# Patient Record
Sex: Female | Born: 1955
Health system: Southern US, Community
[De-identification: ages and names within clinical notes are randomized; demographics above are authoritative.]

## PROBLEM LIST (undated history)

## (undated) DIAGNOSIS — I10 Essential (primary) hypertension: Secondary | ICD-10-CM

## (undated) DIAGNOSIS — E119 Type 2 diabetes mellitus without complications: Secondary | ICD-10-CM

## (undated) DIAGNOSIS — N3090 Cystitis, unspecified without hematuria: Secondary | ICD-10-CM

## (undated) HISTORY — DX: Essential (primary) hypertension: I10

## (undated) HISTORY — PX: ABDOMINAL HYSTERECTOMY: SHX81

## (undated) HISTORY — DX: Cystitis, unspecified without hematuria: N30.90

## (undated) HISTORY — DX: Type 2 diabetes mellitus without complications: E11.9

---

## 2000-08-01 ENCOUNTER — Other Ambulatory Visit: Admission: RE | Admit: 2000-08-01 | Discharge: 2000-08-01 | Payer: Self-pay | Admitting: Obstetrics

## 2003-04-19 ENCOUNTER — Ambulatory Visit (HOSPITAL_COMMUNITY): Admission: RE | Admit: 2003-04-19 | Discharge: 2003-04-19 | Payer: Self-pay | Admitting: Family Medicine

## 2003-04-22 ENCOUNTER — Encounter: Admission: RE | Admit: 2003-04-22 | Discharge: 2003-04-22 | Payer: Self-pay | Admitting: Family Medicine

## 2003-05-04 ENCOUNTER — Ambulatory Visit (HOSPITAL_COMMUNITY): Admission: RE | Admit: 2003-05-04 | Discharge: 2003-05-04 | Payer: Self-pay | Admitting: Family Medicine

## 2003-06-05 ENCOUNTER — Ambulatory Visit (HOSPITAL_COMMUNITY): Admission: RE | Admit: 2003-06-05 | Discharge: 2003-06-05 | Payer: Self-pay | Admitting: Family Medicine

## 2003-06-16 ENCOUNTER — Ambulatory Visit (HOSPITAL_COMMUNITY): Admission: RE | Admit: 2003-06-16 | Discharge: 2003-06-16 | Payer: Self-pay | Admitting: General Surgery

## 2003-06-24 ENCOUNTER — Ambulatory Visit (HOSPITAL_COMMUNITY): Admission: RE | Admit: 2003-06-24 | Discharge: 2003-06-24 | Payer: Self-pay | Admitting: General Surgery

## 2003-07-13 ENCOUNTER — Ambulatory Visit (HOSPITAL_COMMUNITY): Admission: RE | Admit: 2003-07-13 | Discharge: 2003-07-14 | Payer: Self-pay | Admitting: General Surgery

## 2004-01-14 ENCOUNTER — Ambulatory Visit (HOSPITAL_COMMUNITY): Admission: RE | Admit: 2004-01-14 | Discharge: 2004-01-14 | Payer: Self-pay | Admitting: Urology

## 2004-05-01 IMAGING — XA DG CHOLANGIOGRAM OPERATIVE
1 series · 1 of 1 positions shown · non-contrast
Comparison: none

CLINICAL DATA: 47 year old with symptomatic biliary tract.  Laparoscopic cholecystectomy. 
 INTRAOPERATIVE CHOLANGIOGRAM 
 Intraoperative cholangiogram is performed by Dr. Estela De.  One image is submitted, showing opacification of a portion of the intrahepatic and extrahepatic biliary ducts.  Opacification of a portion of the pancreatic duct is also seen. No filling defects are noted. No focal stricture identified.  Patency of the duct is confirmed by the presence of contrast within the duodenum. 
 IMPRESSION 
 No evidence for retained stones.
 Reflux of contrast into normal appearing distal pancreatic duct.

[Series 1: run · 1 of 1 slices shown]
[im 1/1]
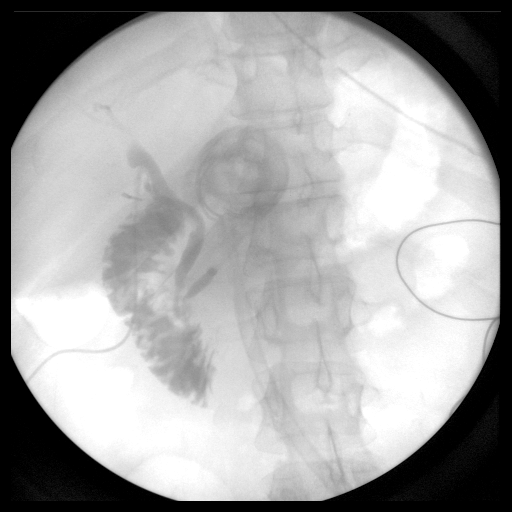

[1 of 1 positions shown; findings below may reference images not displayed]

## 2004-07-26 ENCOUNTER — Other Ambulatory Visit: Admission: RE | Admit: 2004-07-26 | Discharge: 2004-07-26 | Payer: Self-pay | Admitting: Family Medicine

## 2004-08-21 ENCOUNTER — Ambulatory Visit (HOSPITAL_COMMUNITY): Admission: RE | Admit: 2004-08-21 | Discharge: 2004-08-21 | Payer: Self-pay | Admitting: Gastroenterology

## 2004-10-24 ENCOUNTER — Ambulatory Visit (HOSPITAL_COMMUNITY): Admission: RE | Admit: 2004-10-24 | Discharge: 2004-10-24 | Payer: Self-pay | Admitting: Family Medicine

## 2005-04-12 ENCOUNTER — Encounter: Admission: RE | Admit: 2005-04-12 | Discharge: 2005-04-12 | Payer: Self-pay | Admitting: Family Medicine

## 2005-06-02 ENCOUNTER — Encounter: Admission: RE | Admit: 2005-06-02 | Discharge: 2005-06-02 | Payer: Self-pay | Admitting: Family Medicine

## 2005-12-10 ENCOUNTER — Encounter: Admission: RE | Admit: 2005-12-10 | Discharge: 2005-12-10 | Payer: Self-pay | Admitting: Family Medicine

## 2010-06-10 ENCOUNTER — Encounter (HOSPITAL_BASED_OUTPATIENT_CLINIC_OR_DEPARTMENT_OTHER): Payer: Self-pay | Admitting: General Surgery

## 2011-03-12 ENCOUNTER — Ambulatory Visit
Admission: RE | Admit: 2011-03-12 | Discharge: 2011-03-12 | Disposition: A | Discharge status: Home / Self Care | Payer: 59 | Source: Ambulatory Visit | Attending: Family Medicine | Admitting: Family Medicine

## 2011-03-12 ENCOUNTER — Other Ambulatory Visit: Payer: Self-pay | Admitting: Family Medicine

## 2011-03-12 DIAGNOSIS — R1032 Left lower quadrant pain: Secondary | ICD-10-CM

## 2011-03-12 MED ORDER — IOHEXOL 300 MG/ML  SOLN
125.0000 mL | Freq: Once | INTRAMUSCULAR | Status: AC | PRN
  Administered 2011-03-12: 125 mL via INTRAVENOUS

## 2011-12-30 IMAGING — CT CT ABD-PELV W/ CM
1 of 5 series · 14 of 34 positions shown, 19 images · IV contrast (30CC OMNI 300 & [ID] OMNI 300)
Comparison: CT thorax 12/10/2005,  pelvis [DATE]

CLINICAL DATA: Left lower quadrant pain, pain for 9 days.

CT ABDOMEN AND PELVIS WITH CONTRAST
TECHNIQUE: Multidetector CT imaging of the abdomen and pelvis was
performed following the standard protocol during bolus
administration of intravenous contrast.
Contrast: 125mL OMNIPAQUE IOHEXOL 300 MG/ML IV SOLN

[Series 2: abd/pelvis with · axial · 0.80mm/px · z∈[-394,-18]mm · 14 of 86 slices shown, 19 images]
[im 6/86  soft-tissue]
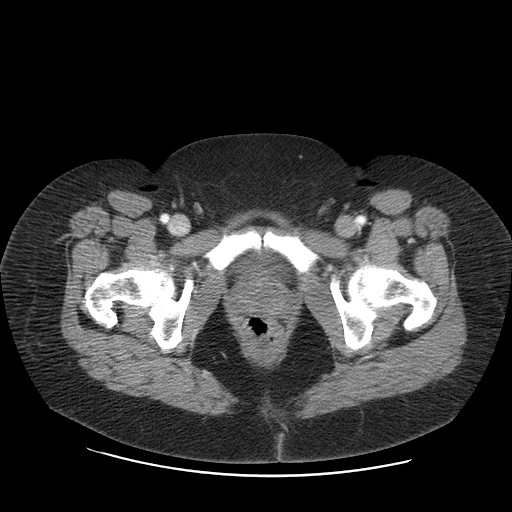
[im 6/86  bone]
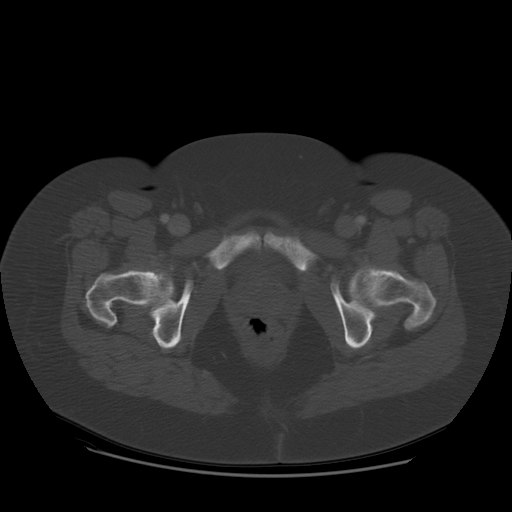
[im 11/86  soft-tissue]
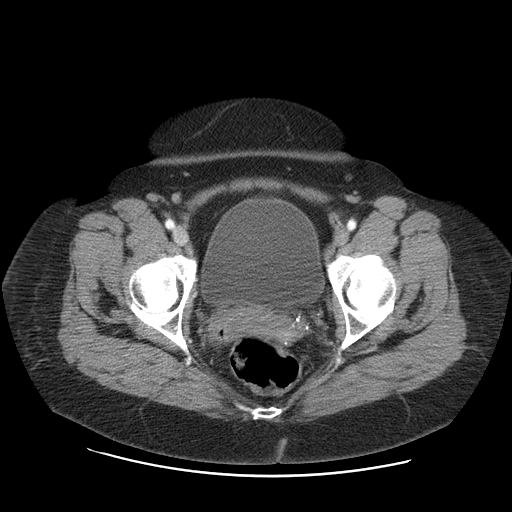
[im 21/86  soft-tissue]
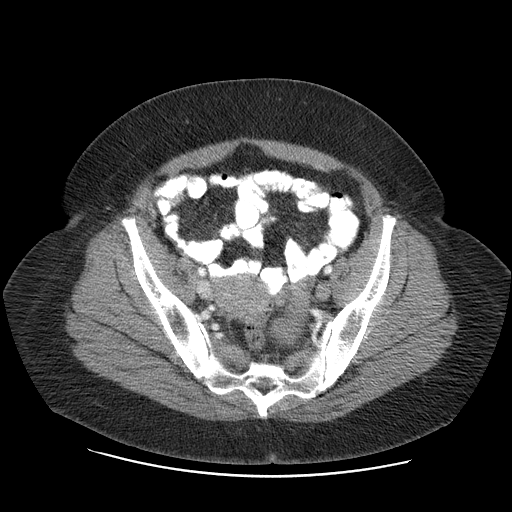
[im 26/86  soft-tissue]
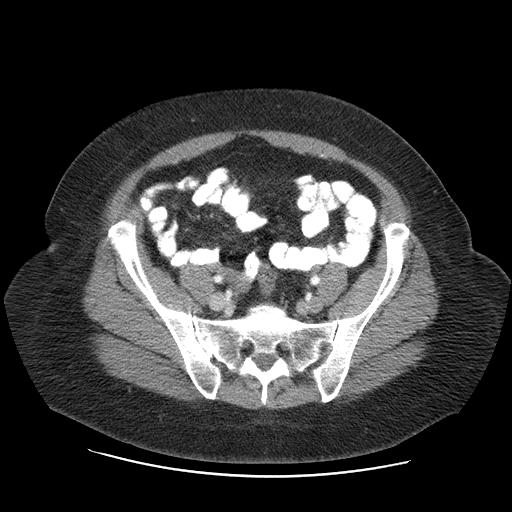
[im 31/86  soft-tissue]
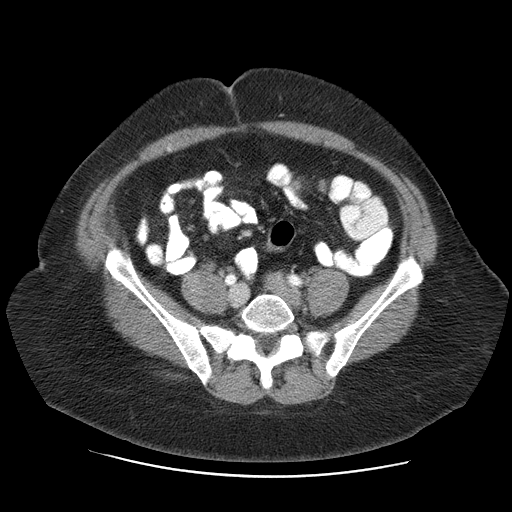
[im 36/86  soft-tissue]
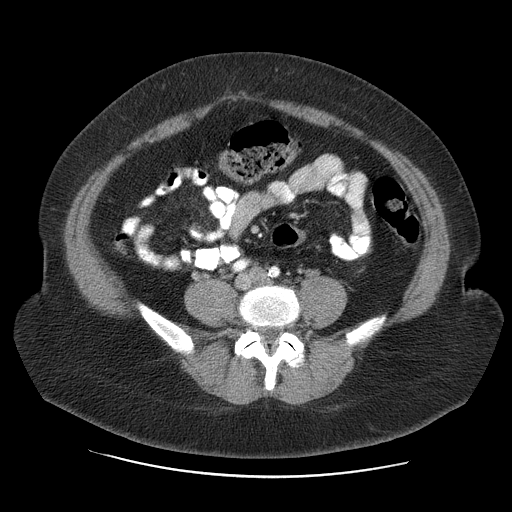
[im 46/86  soft-tissue]
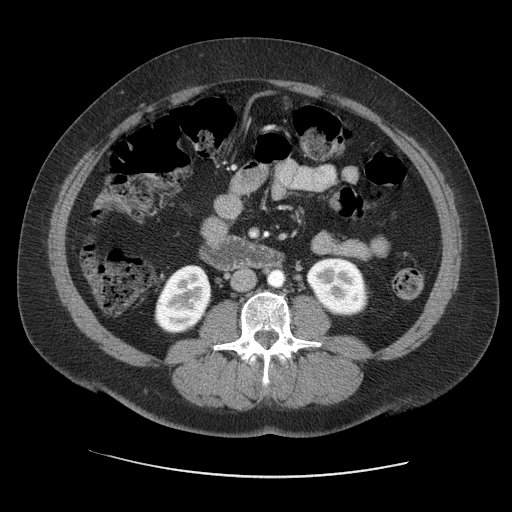
[im 51/86  soft-tissue]
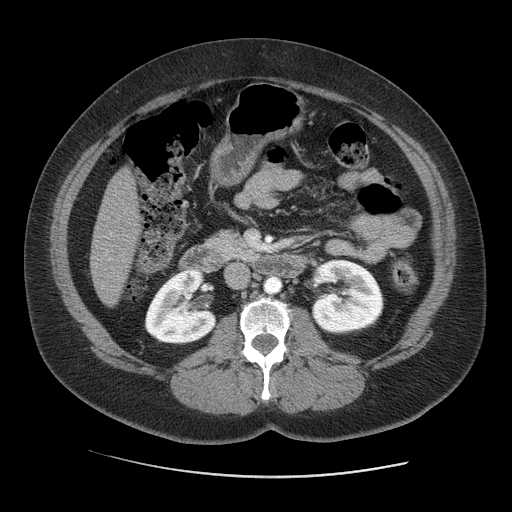
[im 56/86  soft-tissue]
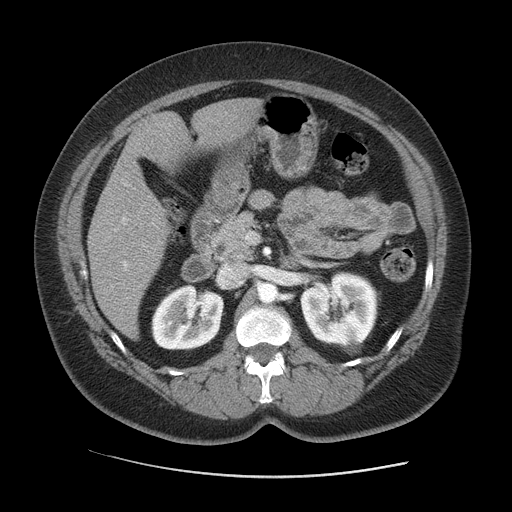
[im 56/86  bone]
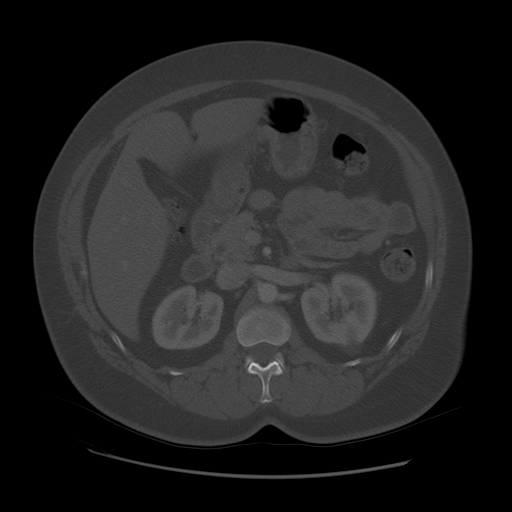
[im 61/86  soft-tissue]
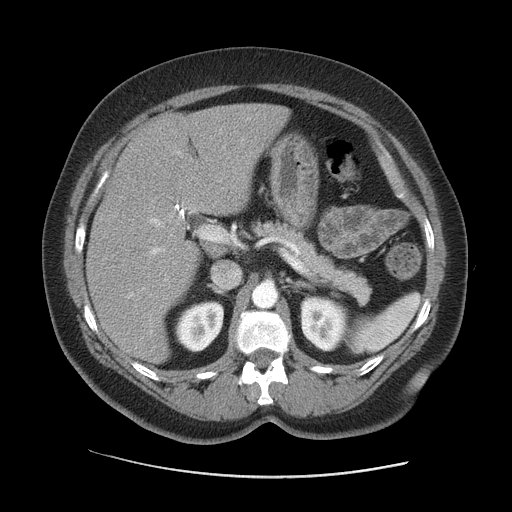
[im 66/86  soft-tissue]
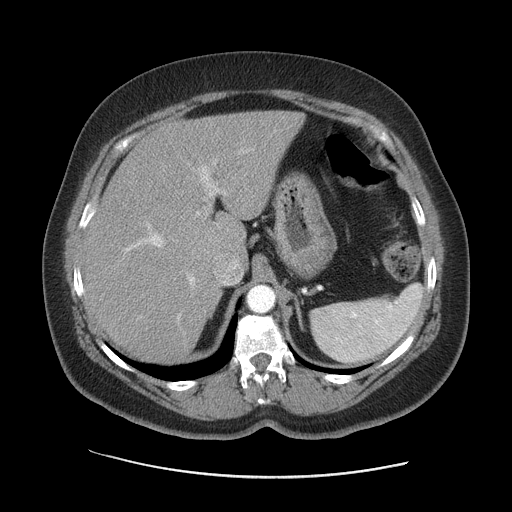
[im 66/86  lung]
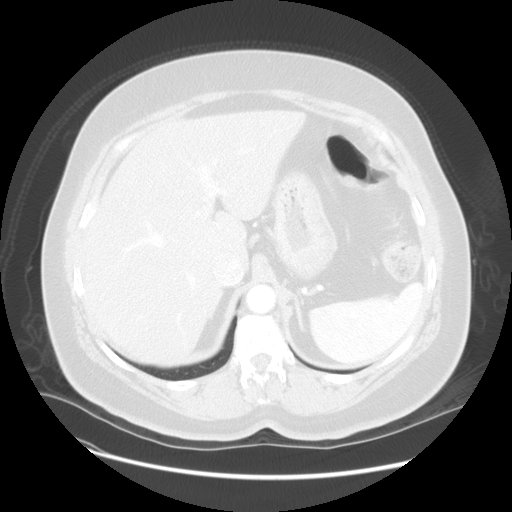
[im 71/86  lung]
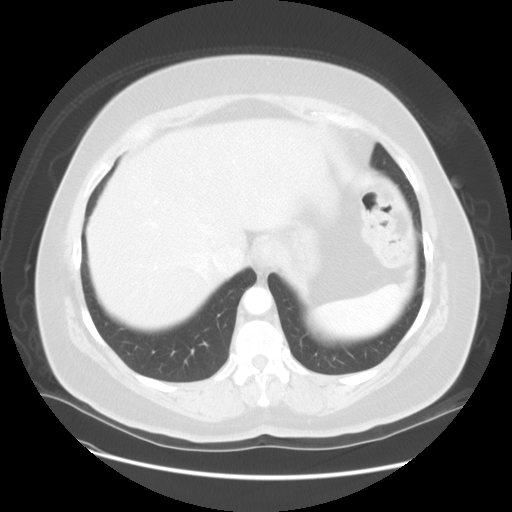
[im 76/86  soft-tissue]
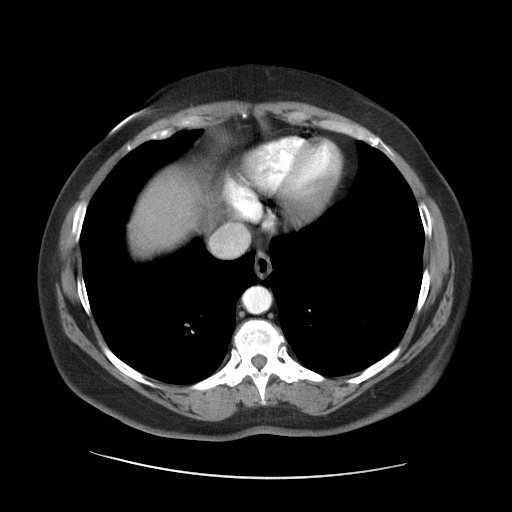
[im 76/86  lung]
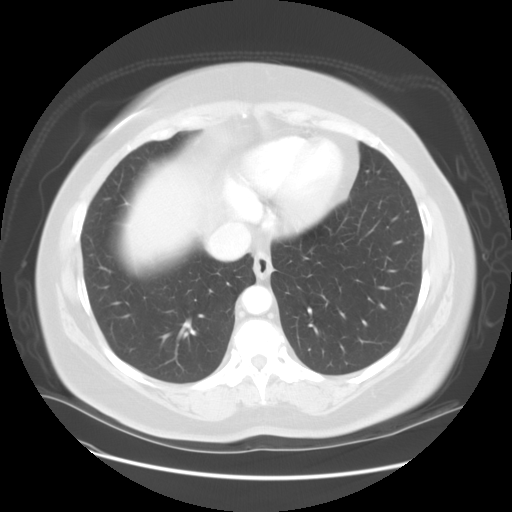
[im 81/86  soft-tissue]
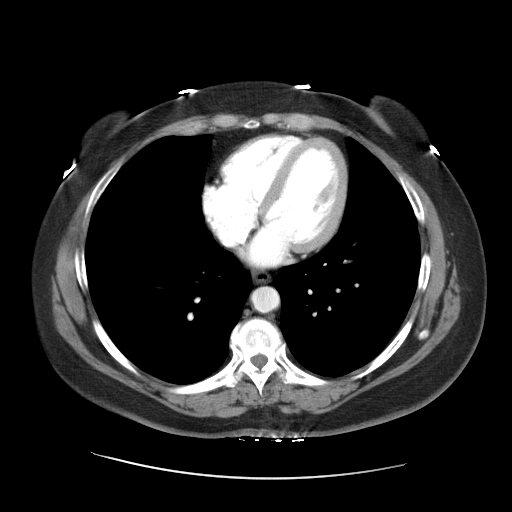
[im 81/86  lung]
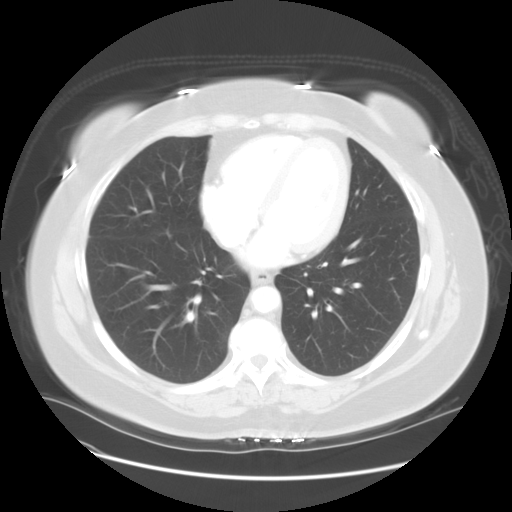

[14 of 34 positions shown; findings below may reference images not displayed]

FINDINGS: Lung bases are clear.  No pericardial fluid.

No focal hepatic lesion.  Gallbladder surgically absent.  The
pancreas, spleen, adrenal glands, right kidney normal.  A simple
cyst within the left kidney.

The stomach, small bowel, and cecum are normal.  Appendix is
normal.  The colon rectosigmoid colon are normal.

Abdominal aorta normal caliber.  No retroperitoneal
lymphadenopathy.

Uterus is normal.  There is a 6.4 x 4.4 x 4.7 cm  rounded left
adnexal mass immediately adjacent to the uterus which is silent.
On comparison MRI from 8991, left ovary measures approximately
x 1.7 cm.  The right ovary is not identified.  Bladder is normal.

No evidence of pelvic lymphadenopathy. Review of  bone windows
demonstrates no aggressive osseous lesions.
IMPRESSION: 1. This is a 6.5 cm smoothly marginated left adnexal mass.  This
was not present on comparison MRI therefore a exophytic leiomyoma
is unlikely.  Recommend GYN consultation and consider pelvic MRI
with contrast  for further characterization.
2.  No acute abdominal pelvic process.

This was made a call report

## 2015-05-21 DIAGNOSIS — N3090 Cystitis, unspecified without hematuria: Secondary | ICD-10-CM

## 2015-05-21 HISTORY — DX: Cystitis, unspecified without hematuria: N30.90

## 2016-12-16 LAB — LIPID PANEL: LDL Cholesterol: 114

## 2016-12-16 LAB — BASIC METABOLIC PANEL: Creatinine: 1 (ref 0.5–1.1)

## 2016-12-16 LAB — HEMOGLOBIN A1C: Hemoglobin A1C: 10.7

## 2017-02-27 ENCOUNTER — Ambulatory Visit (INDEPENDENT_AMBULATORY_CARE_PROVIDER_SITE_OTHER): Payer: 59 | Admitting: Endocrinology

## 2017-02-27 ENCOUNTER — Encounter (INDEPENDENT_AMBULATORY_CARE_PROVIDER_SITE_OTHER): Payer: Self-pay

## 2017-02-27 ENCOUNTER — Encounter: Payer: Self-pay | Admitting: Endocrinology

## 2017-02-27 VITALS — BP 136/90 | HR 61 | Ht 66.0 in | Wt 214.6 lb

## 2017-02-27 DIAGNOSIS — Z794 Long term (current) use of insulin: Secondary | ICD-10-CM

## 2017-02-27 DIAGNOSIS — E669 Obesity, unspecified: Secondary | ICD-10-CM | POA: Diagnosis not present

## 2017-02-27 DIAGNOSIS — E1165 Type 2 diabetes mellitus with hyperglycemia: Secondary | ICD-10-CM

## 2017-02-27 DIAGNOSIS — I10 Essential (primary) hypertension: Secondary | ICD-10-CM

## 2017-02-27 MED ORDER — METFORMIN HCL ER 500 MG PO TB24: 2000.0000 mg | ORAL_TABLET | Freq: Every day | ORAL | 3 refills | Status: DC

## 2017-02-27 NOTE — Patient Instructions (Signed)
Start taking Metformin 500 mg, 1 tablet with your main meal for 5 days. Occasionally this may initially cause loose stools or nausea If  tolerating well after 5 days add a second Metformin tablet (500 mg) at the same time. Continue adding another tablet after 5 days days if no persistent nausea or diarrhea until reaching the maximum tolerated dose or the full dose of 4 tablets once daily.  New basals  Exercise daily

## 2017-02-27 NOTE — Progress Notes (Signed)
Patient ID: Sherry Aguilar, female   DOB: Aug 04, 1955, 61 y.o.   MRN: [id number]          Reason for Appointment : Consultation for Type 1 Diabetes  History of Present Illness          Diagnosis: Type 1 diabetes mellitus, date of diagnosis: 2010          Previous history:  She was initially diagnosed with routine lab work Not clear what medications she was taking after diagnosis and she does not remember taking metformin At some point she was taking Onglyza and Actos and also subsequently Anastasio Auerbach was tried She stopped taking Invokana because of persistent yeast infections Most likely in 2013 because of increasing blood sugars she was started on Levemir and oral hypoglycemic drugs probably stopped She thinks she may have been on insulin injections twice a day for a year but probably did not take any mealtime insulin She has had an A1c of about 7% only once  Recent history:    INSULIN regimen is: Insulin pump, Medtronic BASAL settings: Midnight = 1.1:, 3 AM = 1.25, 8 & 1.1 Carbohydrate coverage 1:7.5 and sensitivity 1:33 with target 120   Current management, blood sugar patterns and problems identified:    She apparently has been on an insulin pump for about 4 years, prior to that had been on Levemir  She has had no significant diabetes education and was doing on the insulin pump by the [employer] representative and she has not had any change in her settings since starting the pump  She does try to change her infusion set every 3 days  She just started a new pump for a replacement a couple of days ago and has minimal data on the pump  BOLUSES: She tries to take these mostly in the morning when her blood sugar is high but frequently will not take boluses for her lunch meal which she is usually eating at her desk  Also may not do a bolus for evening meal especially if her blood sugars are not high  She has not been eating much during the day she does feel shaky around suppertime  although her sugar may not be below 80  She is not doing blood sugar testing after meals and mostly in the morning and before supper time  She has exercised on and off and currently is not doing any even though she has a treadmill at home  She uses various resources and reading labels for reviewing of her carbohydrates at mealtime but may not do this consistently   Glucose monitoring:  is being done 2 times a day         Glucometer:   contour     Blood Glucose readings from  recall:  Mean values apply above for all meters except median for One Touch  PRE-MEAL Fasting Lunch Dinner Bedtime Overall  Glucose range: 250+  80-170 ?   Mean/median:         Hypoglycemia:  occurs at supper time As above Factors causing hyperglycemia: Symptoms of hypoglycemia: Feeling shaky  Self-care: The diet that the patient has been following [id number] sugar drinks  Mealtimes are: Breakfast at [employer]: 7-9 PM          Exercise: none           Dietician consultation: Most recent: Never .          CDE consultation:none   Previous weight range: 210-236  Wt Readings  from Last 3 Encounters:  02/27/17 214 lb 9.6 oz (97.3 kg)   Diabetes labs:  Lab Results  Component Value Date   HGBA1C 10.7 12/16/2016   Lab Results  Component Value Date   LDLCALC 114 12/16/2016   CREATININE 1.0 12/16/2016    No results found for: MICRALBCREAT   Allergies as of 02/27/2017      Reactions   Pecan Nut (diagnostic) Anaphylaxis   Tree nut allergy      Medication List       Accurate as of 02/27/17  3:11 PM. Always use your most recent med list.          CARDURA PO doxazosin takes 1 mg at bedtime   HUMALOG 100 UNIT/ML injection Generic drug:  insulin lispro Inject 50 Units into the skin daily.   metFORMIN 500 MG 24 hr tablet Commonly known as:  GLUCOPHAGE-XR Take 4 tablets (2,000 mg total) by mouth daily with supper.   spironolactone 25 MG tablet Commonly known as:  ALDACTONE Take 25  mg by mouth 2 (two) times daily.   TEKTURNA HCT 150-12.5 MG Tabs Generic drug:  Aliskiren-Hydrochlorothiazide Take 1 tablet by mouth daily.   ZORVOLEX 35 MG Caps Generic drug:  Diclofenac Take 1 tablet by mouth 3 (three) times daily.       Allergies:  Allergies  Allergen Reactions  . Pecan Nut (Diagnostic) Anaphylaxis    Tree nut allergy    Past Medical History:  Diagnosis Date  . Cystitis 2017    No past surgical history on file.  Family History  Problem Relation Age of Onset  . Diabetes Mother   . Heart failure Mother   . Diabetes Father   . Stroke Father   . Diabetes Sister   . Diabetes Brother   . Thyroid disease Neg Hx     Social History:  reports that she has quit smoking. She has never used smokeless tobacco. Her alcohol and drug histories are not on file.      Review of Systems  Constitutional: Negative for weight loss and reduced appetite.  HENT: Negative for headaches.   Eyes: Negative for blurred vision.  Respiratory: Negative for shortness of breath.   Cardiovascular: Negative for leg swelling.  Gastrointestinal: Negative for diarrhea.  Endocrine: Negative for fatigue, cold intolerance and polydipsia.  Genitourinary: Positive for nocturia.  Musculoskeletal: Negative for joint pain.  Skin: Negative for rash.  Neurological: Positive for numbness.       At times and burning in her toes and one of her toes may feel numb  Psychiatric/Behavioral: Negative for insomnia.   Hypertension: She has been on multiple antihypertensives for a few years  Lipids: She has never been on treatment for hypercholesterolemia, last LDL 114  Eye exams: Last 8/18 with optometrist  LABS:  Office Visit on 02/27/2017  Component Date Value Ref Range Status  . Creatinine 12/16/2016 1.0  0.5 - 1.1 Final  . LDL Cholesterol 12/16/2016 114   Final  . Hemoglobin A1C 12/16/2016 10.7   Final    Physical Examination:  BP 136/90   Pulse 61   Ht 5\' 6"  (1.676 m)   Wt 214  lb 9.6 oz (97.3 kg)   LMP 03/31/2011   SpO2 97%   BMI 34.64 kg/m   GENERAL: Moderate generalized obesity present HEENT:         Eye exam shows normal external appearance. Fundus exam difficult to achieve but no gross retinopathy NECK:     acanthosis is present.  No lymphadenopathy.   Thyroid is not enlarged and no nodules felt. No carotid bruit heard    LUNGS:         Chest is symmetrical. Lungs are clear to auscultation.Marland Kitchen   HEART:         Heart sounds:  S1 and S2 are normal. No murmurs or clicks heard., no S3 or S4.   ABDOMEN:  no distention present. Liver and spleen are not palpable.  No other mass or tenderness present.  EXTREMITIES:     There is no edema. No skin lesions present.Marland Kitchen   NEUROLOGICAL:        Vibration sense is minimally reduced in toes. Ankle jerks are absent bilaterally.       Diabetic Foot Exam - Simple   Simple Foot Form Diabetic Foot exam was performed with the following findings:  Yes 02/27/2017  3:05 PM  Visual Inspection No deformities, no ulcerations, no other skin breakdown bilaterally:  Yes Sensation Testing Intact to touch and monofilament testing bilaterally:  Yes Pulse Check Posterior Tibialis and Dorsalis pulse intact bilaterally:  Yes Comments         MUSCULOSKELETAL:       There is no enlargement or deformity of the joints.  SKIN:       No rash, lesions or abnormal pigmentation       ASSESSMENT:  Diabetes type 2,BMI 34 with poor control on insulin  Despite being on an insulin pump she is having poor control with A1c over the last year at least 9% Currently not on any insulin sensitizer despite her being obese and having acanthosis Reportedly has not tried metformin previously  Problems identified:  Inadequate glucose monitoring with usually monitoring only in the morning  Persistently high fasting blood sugars and likely inadequate basal insulin overnight  Irregular eating patterns and not entering carbohydrates for all meals and  snacks  She does not have any blood sugars after meals to help assess her mealtime coverage   Inconsistent bolusing for evening meal  Lack of exercise  She is not rotating her infusion set sites  Also has relatively minimal knowledge about pump management and diabetes  Complications: None evident probably needs to be getting a urine microalbumin done  Mild hypercholesterolemia: Currently not on a statin drug and will need reassessment and potentially treatment with a statin  HYPERTENSION: Followed by PCP  PLAN:   SETTINGS for the pump to be changed as follows: Midnight = 1.4, 5 AM = 1.6, 8 a.m. = 1.1 and 4 PM = 1.0 Patient was assisted in making the setting changes, she is not able to do it on her own  She will see the diabetes nurse educator and this may be coordinated with her getting the new 670 pump, not clear if she is getting the sensor with this She will start metformin ER and detail instructions given on doses dietitian with starting dose of 500 mg at dinnertime and titrating up to 2000 mg of tolerated over 4 weeks Start regular exercise program Start monitoring blood sugars at least 2-3 times a day including after meals She is not able to get the guardian sensor with the pump she will be a candidate for the freestyle Libre  Bolus consistently with every meal and carbohydrate intake Follow-up in 4 weeks  Patient Instructions  Start taking Metformin 500 mg, 1 tablet with your main meal for 5 days. Occasionally this may initially cause loose stools or nausea If  tolerating well after 5 days  add a second Metformin tablet (500 mg) at the same time. Continue adding another tablet after 5 days days if no persistent nausea or diarrhea until reaching the maximum tolerated dose or the full dose of 4 tablets once daily.  New basals  Exercise daily   Counseling time on subjects discussed in assessment and plan sections is over 50% of today's 60 minute  visit   Santhosh Gulino 02/27/2017, 3:11 PM   Note: This note was prepared with Estate agent. Any transcriptional errors that result from this process are unintentional.

## 2017-04-08 ENCOUNTER — Encounter: Payer: Self-pay | Admitting: Endocrinology

## 2017-04-08 ENCOUNTER — Ambulatory Visit: Payer: 59 | Admitting: Endocrinology

## 2017-04-08 VITALS — BP 116/76 | HR 70 | Ht 66.0 in | Wt 219.8 lb

## 2017-04-08 DIAGNOSIS — E1165 Type 2 diabetes mellitus with hyperglycemia: Secondary | ICD-10-CM | POA: Diagnosis not present

## 2017-04-08 DIAGNOSIS — Z794 Long term (current) use of insulin: Secondary | ICD-10-CM

## 2017-04-08 LAB — POCT GLYCOSYLATED HEMOGLOBIN (HGB A1C): Hemoglobin A1C: 8.2

## 2017-04-08 NOTE — Addendum Note (Signed)
Addended by: Sheffield Slider L on: 04/08/2017 01:02 PM   Modules accepted: Orders

## 2017-04-08 NOTE — Progress Notes (Signed)
Patient ID: Sherry Aguilar, female   DOB: 1955/10/16, 61 y.o.   MRN: [id number]          Reason for Appointment : Follow-up for Type 1 Diabetes  History of Present Illness          Diagnosis: Type 1 diabetes mellitus, date of diagnosis: 2010          Previous history:  She was initially diagnosed with routine lab work Not clear what medications she was taking after diagnosis and she does not remember taking metformin At some point she was taking Onglyza and Actos and also subsequently Anastasio Auerbach was tried She stopped taking Invokana because of persistent yeast infections Most likely in 2013 because of increasing blood sugars she was started on Levemir and oral hypoglycemic drugs probably stopped She thinks she may have been on insulin injections twice a day for a year but probably did not take any mealtime insulin She has had an A1c of about 7% only once  Recent history:   Her A1c was 10.7 in July and now down to 8.2  INSULIN regimen is: Insulin pump, Med[employer] BASAL settings: Midnight = 1.4, 5 AM = 1.6 and 8 AM = 1.0:,  Carbohydrate coverage 1:7.5 and sensitivity 1:33 with target 120   Current management, blood sugar patterns and problems identified:    She was given prescription for her contour test strips and is able to check her sugars now; however only her blood sugars at the time of the bolus are registered on the pump download  With increasing her basal rate overnight and early morning and adding metformin her blood sugars are better  She did have a little diarrhea with starting metformin in the first couple of weeks but is now tolerating well  However with her blood sugars coming down her weight has gone up  FASTING readings previously were about 250 but now they are fairly close to normal without overnight hypoglycemia  Lunchtime readings are variable, she thinks that sometimes they are higher from stress and in activity at work but also has had 3 or 4 mixed boluses in  the morning at breakfast  She does eat some protein at breakfast but recently appears to be getting low normal readings after breakfast, yesterday was down to 67 about 2 hours later  She is trying to bolus with her meals but may occasionally forget   Glucose monitoring:  is being done 2 times a day         Glucometer:   contour     Blood Glucose readings from  download :  Mean values apply above for all meters except median for One Touch  PRE-MEAL Fasting Lunch Dinner Bedtime Overall  Glucose range:  97-201   85-230   79-297     Mean/median: 139  159  170   159    POST-MEAL PC Breakfast PC Lunch PC Dinner  Glucose range: 67, 94     Mean/median:       Hypoglycemia:  occurs at supper time As above Factors causing hyperglycemia: Symptoms of hypoglycemia: Feeling shaky  Self-care: The diet that the patient has been following [id number] sugar drinks  Mealtimes are: Breakfast at 9am, toast   Dinner: 7-9 PM          Exercise: none           Dietician consultation: Most recent: Never .          CDE consultation:none   Previous weight range: 210-236  Wt Readings from Last 3 Encounters:  04/08/17 219 lb 12.8 oz (99.7 kg)  02/27/17 214 lb 9.6 oz (97.3 kg)   Diabetes labs:  Lab Results  Component Value Date   HGBA1C 10.7 12/16/2016   Lab Results  Component Value Date   LDLCALC 114 12/16/2016   CREATININE 1.0 12/16/2016    No results found for: MICRALBCREAT   Allergies as of 04/08/2017      Reactions   Pecan Nut (diagnostic) Anaphylaxis   Tree nut allergy      Medication List        Accurate as of 04/08/17 11:24 AM. Always use your most recent med list.          CARDURA PO doxazosin takes 1 mg at bedtime   HUMALOG 100 UNIT/ML injection Generic drug:  insulin lispro Inject 50 Units into the skin daily.   metFORMIN 500 MG 24 hr tablet Commonly known as:  GLUCOPHAGE-XR Take 4 tablets (2,000 mg total) by mouth daily with supper.   spironolactone 25 MG  tablet Commonly known as:  ALDACTONE Take 25 mg by mouth 2 (two) times daily.   TEKTURNA HCT 150-12.5 MG Tabs Generic drug:  Aliskiren-Hydrochlorothiazide Take 1 tablet by mouth daily.   ZORVOLEX 35 MG Caps Generic drug:  Diclofenac Take 1 tablet by mouth 3 (three) times daily.       Allergies:  Allergies  Allergen Reactions  . Pecan Nut (Diagnostic) Anaphylaxis    Tree nut allergy    Past Medical History:  Diagnosis Date  . Cystitis 2017    No past surgical history on file.  Family History  Problem Relation Age of Onset  . Diabetes Mother   . Heart failure Mother   . Diabetes Father   . Stroke Father   . Diabetes Sister   . Diabetes Brother   . Thyroid disease Neg Hx     Social History:  reports that she has quit smoking. she has never used smokeless tobacco. Her alcohol and drug histories are not on file.      Review of Systems   Hypertension: She has been on multiple antihypertensives for a few years  Lipids: She has never been on treatment for hypercholesterolemia, last LDL 114  Eye exams: Last 8/18 with optometrist  LABS:  No visits with results within 1 Week(s) from this visit.  Latest known visit with results is:  Office Visit on 02/27/2017  Component Date Value Ref Range Status  . Creatinine 12/16/2016 1.0  0.5 - 1.1 Final  . LDL Cholesterol 12/16/2016 114   Final  . Hemoglobin A1C 12/16/2016 10.7   Final    Physical Examination:  BP 116/76   Pulse 70   Ht 5\' 6"  (1.676 m)   Wt 219 lb 12.8 oz (99.7 kg)   LMP 03/31/2011   SpO2 95%   BMI 35.48 kg/m    ASSESSMENT:  Diabetes type 2,BMI 34 with poor control on insulin  Despite being on an insulin pump she is having poor control with A1c over the last year at least 9% Currently not on any insulin sensitizer despite her being obese and having acanthosis Reportedly has not tried metformin previously  Problems identified:  Inadequate glucose monitoring with usually monitoring only in  the morning  Persistently high fasting blood sugars and likely inadequate basal insulin overnight  Irregular eating patterns and not entering carbohydrates for all meals and snacks  She does not have any blood sugars after meals to help assess her  mealtime coverage   Inconsistent bolusing for evening meal  Lack of exercise  She is not rotating her infusion set sites  Also has relatively minimal knowledge about pump management and diabetes  Complications: None evident probably needs to be getting a urine microalbumin done  Mild hypercholesterolemia: Currently not on a statin drug and will need reassessment and potentially treatment with a statin  HYPERTENSION: Followed by PCP  PLAN:   SETTINGS for the pump to be changed as follows: Carbohydrate coverage 1:9.  Breakfast and 1:7 for lunch and dinner Basal rate unchanged except 5 AM basal of 1.6 only until 7 AM, 7 AM = 1.0  She will see the diabetes nurse educator Start regular exercise program Start monitoring blood sugars at least once a day a couple of hours after eating  If she is not able to get the guardian sensor with the pump she will be a candidate for the freestyle Elenor Legato, she will discuss with nurse educator  Bolus needs to be done consistently with every meal and carbohydrate intake Follow-up in 2 months   There are no Patient Instructions on file for this visit.  Counseling time on subjects discussed in assessment and plan sections is over 50% of today's 25 minute visit   Nyaira Hodgens 04/08/2017, 11:24 AM   Note: This note was prepared with Estate agent. Any transcriptional errors that result from this process are unintentional.

## 2017-04-09 ENCOUNTER — Telehealth: Payer: Self-pay | Admitting: Nutrition

## 2017-04-09 NOTE — Telephone Encounter (Signed)
Talked with patient about making an appt. With me to discuss CGM options, and more pump training.  He declined this for now, saying he is waiting to hear if his insurance will pay for the [employer]tronic sensors, and will let me know if he gets them.

## 2017-06-05 ENCOUNTER — Other Ambulatory Visit (INDEPENDENT_AMBULATORY_CARE_PROVIDER_SITE_OTHER): Payer: 59

## 2017-06-05 DIAGNOSIS — Z794 Long term (current) use of insulin: Secondary | ICD-10-CM | POA: Diagnosis not present

## 2017-06-05 DIAGNOSIS — E1165 Type 2 diabetes mellitus with hyperglycemia: Secondary | ICD-10-CM | POA: Diagnosis not present

## 2017-06-05 LAB — LIPID PANEL
CHOL/HDL RATIO: 4
Cholesterol: 172 mg/dL (ref 0–200)
HDL: 47.2 mg/dL (ref >39.00)
LDL CALC: 92 mg/dL (ref 0–99)
NONHDL: 125.28
Triglycerides: 164 mg/dL — ABNORMAL HIGH (ref 0.0–149.0)
VLDL: 32.8 mg/dL (ref 0.0–40.0)

## 2017-06-05 LAB — BASIC METABOLIC PANEL
BUN: 11 mg/dL (ref 6–23)
CHLORIDE: 103 meq/L (ref 96–112)
CO2: 27 mEq/L (ref 19–32)
CREATININE: 0.77 mg/dL (ref 0.40–1.20)
Calcium: 9.7 mg/dL (ref 8.4–10.5)
GFR: 97.16 mL/min (ref >60.00)
Glucose, Bld: 114 mg/dL — ABNORMAL HIGH (ref 70–99)
Potassium: 4 mEq/L (ref 3.5–5.1)
Sodium: 137 mEq/L (ref 135–145)

## 2017-06-06 LAB — FRUCTOSAMINE: Fructosamine: 300 umol/L — ABNORMAL HIGH (ref 0–285)

## 2017-06-09 ENCOUNTER — Ambulatory Visit: Payer: 59 | Admitting: Endocrinology

## 2017-06-09 DIAGNOSIS — Z0289 Encounter for other administrative examinations: Secondary | ICD-10-CM

## 2017-07-10 ENCOUNTER — Ambulatory Visit: Payer: 59 | Admitting: Endocrinology

## 2017-07-10 ENCOUNTER — Other Ambulatory Visit: Payer: Self-pay | Admitting: Endocrinology

## 2017-08-15 ENCOUNTER — Ambulatory Visit: Payer: 59 | Admitting: Endocrinology

## 2018-10-13 DIAGNOSIS — E782 Mixed hyperlipidemia: Secondary | ICD-10-CM | POA: Diagnosis not present

## 2018-10-13 DIAGNOSIS — E1165 Type 2 diabetes mellitus with hyperglycemia: Secondary | ICD-10-CM | POA: Diagnosis not present

## 2018-10-13 DIAGNOSIS — E119 Type 2 diabetes mellitus without complications: Secondary | ICD-10-CM | POA: Diagnosis not present

## 2018-10-13 DIAGNOSIS — I10 Essential (primary) hypertension: Secondary | ICD-10-CM | POA: Diagnosis not present

## 2018-10-14 DIAGNOSIS — E1165 Type 2 diabetes mellitus with hyperglycemia: Secondary | ICD-10-CM | POA: Diagnosis not present

## 2018-11-17 DIAGNOSIS — R9431 Abnormal electrocardiogram [ECG] [EKG]: Secondary | ICD-10-CM | POA: Diagnosis not present

## 2018-11-17 DIAGNOSIS — R0600 Dyspnea, unspecified: Secondary | ICD-10-CM | POA: Diagnosis not present

## 2018-11-17 DIAGNOSIS — E0865 Diabetes mellitus due to underlying condition with hyperglycemia: Secondary | ICD-10-CM | POA: Diagnosis not present

## 2018-11-17 DIAGNOSIS — R109 Unspecified abdominal pain: Secondary | ICD-10-CM | POA: Diagnosis not present

## 2018-11-17 DIAGNOSIS — R112 Nausea with vomiting, unspecified: Secondary | ICD-10-CM | POA: Diagnosis not present

## 2018-11-17 DIAGNOSIS — I200 Unstable angina: Secondary | ICD-10-CM | POA: Diagnosis not present

## 2018-11-17 DIAGNOSIS — I161 Hypertensive emergency: Secondary | ICD-10-CM | POA: Diagnosis not present

## 2018-11-18 DIAGNOSIS — R9431 Abnormal electrocardiogram [ECG] [EKG]: Secondary | ICD-10-CM | POA: Diagnosis not present

## 2018-11-18 DIAGNOSIS — R0789 Other chest pain: Secondary | ICD-10-CM | POA: Diagnosis not present

## 2018-11-18 DIAGNOSIS — E782 Mixed hyperlipidemia: Secondary | ICD-10-CM | POA: Diagnosis not present

## 2018-11-18 DIAGNOSIS — R0600 Dyspnea, unspecified: Secondary | ICD-10-CM | POA: Diagnosis not present

## 2018-11-18 DIAGNOSIS — I119 Hypertensive heart disease without heart failure: Secondary | ICD-10-CM | POA: Diagnosis not present

## 2018-11-18 DIAGNOSIS — I209 Angina pectoris, unspecified: Secondary | ICD-10-CM | POA: Diagnosis not present

## 2018-11-18 DIAGNOSIS — M940 Chondrocostal junction syndrome [Tietze]: Secondary | ICD-10-CM | POA: Diagnosis not present

## 2018-11-18 DIAGNOSIS — I272 Pulmonary hypertension, unspecified: Secondary | ICD-10-CM | POA: Diagnosis not present

## 2018-11-18 DIAGNOSIS — I10 Essential (primary) hypertension: Secondary | ICD-10-CM | POA: Diagnosis not present

## 2018-11-18 DIAGNOSIS — I200 Unstable angina: Secondary | ICD-10-CM | POA: Diagnosis not present

## 2018-11-20 DIAGNOSIS — E119 Type 2 diabetes mellitus without complications: Secondary | ICD-10-CM | POA: Diagnosis not present

## 2018-11-20 DIAGNOSIS — J45909 Unspecified asthma, uncomplicated: Secondary | ICD-10-CM | POA: Diagnosis not present

## 2018-11-20 DIAGNOSIS — Z20828 Contact with and (suspected) exposure to other viral communicable diseases: Secondary | ICD-10-CM | POA: Diagnosis not present

## 2018-11-20 DIAGNOSIS — I10 Essential (primary) hypertension: Secondary | ICD-10-CM | POA: Diagnosis not present

## 2018-11-23 DIAGNOSIS — I10 Essential (primary) hypertension: Secondary | ICD-10-CM | POA: Diagnosis not present

## 2018-11-23 DIAGNOSIS — E782 Mixed hyperlipidemia: Secondary | ICD-10-CM | POA: Diagnosis not present

## 2018-11-23 DIAGNOSIS — I2510 Atherosclerotic heart disease of native coronary artery without angina pectoris: Secondary | ICD-10-CM | POA: Diagnosis not present

## 2018-11-23 DIAGNOSIS — I739 Peripheral vascular disease, unspecified: Secondary | ICD-10-CM | POA: Diagnosis not present

## 2018-11-25 DIAGNOSIS — I97630 Postprocedural hematoma of a circulatory system organ or structure following a cardiac catheterization: Secondary | ICD-10-CM | POA: Diagnosis not present

## 2018-11-25 DIAGNOSIS — I2510 Atherosclerotic heart disease of native coronary artery without angina pectoris: Secondary | ICD-10-CM | POA: Diagnosis not present

## 2018-12-17 DIAGNOSIS — R0789 Other chest pain: Secondary | ICD-10-CM | POA: Diagnosis not present

## 2018-12-17 DIAGNOSIS — Z1211 Encounter for screening for malignant neoplasm of colon: Secondary | ICD-10-CM | POA: Diagnosis not present

## 2018-12-17 DIAGNOSIS — K219 Gastro-esophageal reflux disease without esophagitis: Secondary | ICD-10-CM | POA: Diagnosis not present

## 2018-12-21 DIAGNOSIS — Z01818 Encounter for other preprocedural examination: Secondary | ICD-10-CM | POA: Diagnosis not present

## 2018-12-30 DIAGNOSIS — K635 Polyp of colon: Secondary | ICD-10-CM | POA: Diagnosis not present

## 2018-12-30 DIAGNOSIS — K3189 Other diseases of stomach and duodenum: Secondary | ICD-10-CM | POA: Diagnosis not present

## 2018-12-30 DIAGNOSIS — K2970 Gastritis, unspecified, without bleeding: Secondary | ICD-10-CM | POA: Diagnosis not present

## 2018-12-30 DIAGNOSIS — D126 Benign neoplasm of colon, unspecified: Secondary | ICD-10-CM | POA: Diagnosis not present

## 2018-12-30 DIAGNOSIS — K5730 Diverticulosis of large intestine without perforation or abscess without bleeding: Secondary | ICD-10-CM | POA: Diagnosis not present

## 2018-12-30 DIAGNOSIS — K621 Rectal polyp: Secondary | ICD-10-CM | POA: Diagnosis not present

## 2018-12-30 DIAGNOSIS — K219 Gastro-esophageal reflux disease without esophagitis: Secondary | ICD-10-CM | POA: Diagnosis not present

## 2018-12-30 DIAGNOSIS — R0789 Other chest pain: Secondary | ICD-10-CM | POA: Diagnosis not present

## 2018-12-30 DIAGNOSIS — Z1211 Encounter for screening for malignant neoplasm of colon: Secondary | ICD-10-CM | POA: Diagnosis not present

## 2018-12-30 DIAGNOSIS — K449 Diaphragmatic hernia without obstruction or gangrene: Secondary | ICD-10-CM | POA: Diagnosis not present

## 2019-01-13 DIAGNOSIS — E782 Mixed hyperlipidemia: Secondary | ICD-10-CM | POA: Diagnosis not present

## 2019-01-13 DIAGNOSIS — I10 Essential (primary) hypertension: Secondary | ICD-10-CM | POA: Diagnosis not present

## 2019-01-13 DIAGNOSIS — E1165 Type 2 diabetes mellitus with hyperglycemia: Secondary | ICD-10-CM | POA: Diagnosis not present

## 2019-01-14 DIAGNOSIS — I10 Essential (primary) hypertension: Secondary | ICD-10-CM | POA: Diagnosis not present

## 2019-01-14 DIAGNOSIS — E1169 Type 2 diabetes mellitus with other specified complication: Secondary | ICD-10-CM | POA: Diagnosis not present

## 2019-01-14 DIAGNOSIS — K219 Gastro-esophageal reflux disease without esophagitis: Secondary | ICD-10-CM | POA: Diagnosis not present

## 2019-01-21 DIAGNOSIS — K219 Gastro-esophageal reflux disease without esophagitis: Secondary | ICD-10-CM | POA: Diagnosis not present

## 2019-01-21 DIAGNOSIS — K5730 Diverticulosis of large intestine without perforation or abscess without bleeding: Secondary | ICD-10-CM | POA: Diagnosis not present

## 2019-01-21 DIAGNOSIS — Z86010 Personal history of colonic polyps: Secondary | ICD-10-CM | POA: Diagnosis not present

## 2019-02-13 DIAGNOSIS — Z1231 Encounter for screening mammogram for malignant neoplasm of breast: Secondary | ICD-10-CM | POA: Diagnosis not present

## 2019-05-27 DIAGNOSIS — E785 Hyperlipidemia, unspecified: Secondary | ICD-10-CM | POA: Diagnosis not present

## 2019-05-27 DIAGNOSIS — E119 Type 2 diabetes mellitus without complications: Secondary | ICD-10-CM | POA: Diagnosis not present

## 2019-05-27 DIAGNOSIS — I10 Essential (primary) hypertension: Secondary | ICD-10-CM | POA: Diagnosis not present

## 2019-05-27 DIAGNOSIS — R7309 Other abnormal glucose: Secondary | ICD-10-CM | POA: Diagnosis not present

## 2019-05-27 DIAGNOSIS — R799 Abnormal finding of blood chemistry, unspecified: Secondary | ICD-10-CM | POA: Diagnosis not present

## 2019-06-03 DIAGNOSIS — E1165 Type 2 diabetes mellitus with hyperglycemia: Secondary | ICD-10-CM | POA: Diagnosis not present

## 2019-06-12 DIAGNOSIS — Z0131 Encounter for examination of blood pressure with abnormal findings: Secondary | ICD-10-CM | POA: Diagnosis not present

## 2019-06-12 DIAGNOSIS — R1031 Right lower quadrant pain: Secondary | ICD-10-CM | POA: Diagnosis not present

## 2019-06-12 DIAGNOSIS — M549 Dorsalgia, unspecified: Secondary | ICD-10-CM | POA: Diagnosis not present

## 2019-07-05 DIAGNOSIS — E0842 Diabetes mellitus due to underlying condition with diabetic polyneuropathy: Secondary | ICD-10-CM | POA: Diagnosis not present

## 2019-07-05 DIAGNOSIS — I10 Essential (primary) hypertension: Secondary | ICD-10-CM | POA: Diagnosis not present

## 2019-07-05 DIAGNOSIS — E1169 Type 2 diabetes mellitus with other specified complication: Secondary | ICD-10-CM | POA: Diagnosis not present

## 2019-07-27 DIAGNOSIS — I10 Essential (primary) hypertension: Secondary | ICD-10-CM | POA: Diagnosis not present

## 2019-07-27 DIAGNOSIS — E782 Mixed hyperlipidemia: Secondary | ICD-10-CM | POA: Diagnosis not present

## 2019-07-27 DIAGNOSIS — E1140 Type 2 diabetes mellitus with diabetic neuropathy, unspecified: Secondary | ICD-10-CM | POA: Diagnosis not present

## 2019-07-27 DIAGNOSIS — E1169 Type 2 diabetes mellitus with other specified complication: Secondary | ICD-10-CM | POA: Diagnosis not present

## 2019-08-05 DIAGNOSIS — Z23 Encounter for immunization: Secondary | ICD-10-CM | POA: Diagnosis not present

## 2019-08-26 DIAGNOSIS — K219 Gastro-esophageal reflux disease without esophagitis: Secondary | ICD-10-CM | POA: Diagnosis not present

## 2019-08-26 DIAGNOSIS — Z86010 Personal history of colonic polyps: Secondary | ICD-10-CM | POA: Diagnosis not present

## 2019-08-26 DIAGNOSIS — R110 Nausea: Secondary | ICD-10-CM | POA: Diagnosis not present

## 2019-09-02 DIAGNOSIS — Z23 Encounter for immunization: Secondary | ICD-10-CM | POA: Diagnosis not present

## 2019-12-15 DIAGNOSIS — E1169 Type 2 diabetes mellitus with other specified complication: Secondary | ICD-10-CM | POA: Diagnosis not present

## 2019-12-15 DIAGNOSIS — E7800 Pure hypercholesterolemia, unspecified: Secondary | ICD-10-CM | POA: Diagnosis not present

## 2019-12-15 DIAGNOSIS — I10 Essential (primary) hypertension: Secondary | ICD-10-CM | POA: Diagnosis not present

## 2019-12-15 DIAGNOSIS — E785 Hyperlipidemia, unspecified: Secondary | ICD-10-CM | POA: Diagnosis not present

## 2020-01-25 DIAGNOSIS — E1169 Type 2 diabetes mellitus with other specified complication: Secondary | ICD-10-CM | POA: Diagnosis not present

## 2020-01-25 DIAGNOSIS — E782 Mixed hyperlipidemia: Secondary | ICD-10-CM | POA: Diagnosis not present

## 2020-01-25 DIAGNOSIS — I10 Essential (primary) hypertension: Secondary | ICD-10-CM | POA: Diagnosis not present

## 2020-01-26 DIAGNOSIS — E1169 Type 2 diabetes mellitus with other specified complication: Secondary | ICD-10-CM | POA: Diagnosis not present

## 2020-01-26 DIAGNOSIS — E785 Hyperlipidemia, unspecified: Secondary | ICD-10-CM | POA: Diagnosis not present

## 2020-01-26 DIAGNOSIS — I10 Essential (primary) hypertension: Secondary | ICD-10-CM | POA: Diagnosis not present

## 2020-02-19 DIAGNOSIS — Z1231 Encounter for screening mammogram for malignant neoplasm of breast: Secondary | ICD-10-CM | POA: Diagnosis not present

## 2020-03-29 DIAGNOSIS — E1165 Type 2 diabetes mellitus with hyperglycemia: Secondary | ICD-10-CM | POA: Diagnosis not present

## 2020-03-29 DIAGNOSIS — E782 Mixed hyperlipidemia: Secondary | ICD-10-CM | POA: Diagnosis not present

## 2020-03-29 DIAGNOSIS — I10 Essential (primary) hypertension: Secondary | ICD-10-CM | POA: Diagnosis not present

## 2020-03-29 DIAGNOSIS — E119 Type 2 diabetes mellitus without complications: Secondary | ICD-10-CM | POA: Diagnosis not present

## 2020-04-06 DIAGNOSIS — Z23 Encounter for immunization: Secondary | ICD-10-CM | POA: Diagnosis not present

## 2020-04-09 DIAGNOSIS — E119 Type 2 diabetes mellitus without complications: Secondary | ICD-10-CM | POA: Diagnosis not present

## 2020-05-19 DIAGNOSIS — I10 Essential (primary) hypertension: Secondary | ICD-10-CM | POA: Diagnosis not present

## 2020-05-19 DIAGNOSIS — E785 Hyperlipidemia, unspecified: Secondary | ICD-10-CM | POA: Diagnosis not present

## 2020-05-19 DIAGNOSIS — E1169 Type 2 diabetes mellitus with other specified complication: Secondary | ICD-10-CM | POA: Diagnosis not present

## 2020-07-27 DIAGNOSIS — E1140 Type 2 diabetes mellitus with diabetic neuropathy, unspecified: Secondary | ICD-10-CM | POA: Diagnosis not present

## 2020-07-27 DIAGNOSIS — E1165 Type 2 diabetes mellitus with hyperglycemia: Secondary | ICD-10-CM | POA: Diagnosis not present

## 2020-07-27 DIAGNOSIS — E785 Hyperlipidemia, unspecified: Secondary | ICD-10-CM | POA: Diagnosis not present

## 2020-07-27 DIAGNOSIS — E1169 Type 2 diabetes mellitus with other specified complication: Secondary | ICD-10-CM | POA: Diagnosis not present

## 2020-07-27 DIAGNOSIS — I10 Essential (primary) hypertension: Secondary | ICD-10-CM | POA: Diagnosis not present

## 2020-08-15 DIAGNOSIS — E1165 Type 2 diabetes mellitus with hyperglycemia: Secondary | ICD-10-CM | POA: Diagnosis not present

## 2020-08-15 DIAGNOSIS — I10 Essential (primary) hypertension: Secondary | ICD-10-CM | POA: Diagnosis not present

## 2020-08-15 DIAGNOSIS — E1169 Type 2 diabetes mellitus with other specified complication: Secondary | ICD-10-CM | POA: Diagnosis not present

## 2020-08-15 DIAGNOSIS — E119 Type 2 diabetes mellitus without complications: Secondary | ICD-10-CM | POA: Diagnosis not present

## 2020-09-19 DIAGNOSIS — R634 Abnormal weight loss: Secondary | ICD-10-CM | POA: Diagnosis not present

## 2020-09-19 DIAGNOSIS — Z86010 Personal history of colonic polyps: Secondary | ICD-10-CM | POA: Diagnosis not present

## 2020-09-19 DIAGNOSIS — K5900 Constipation, unspecified: Secondary | ICD-10-CM | POA: Diagnosis not present

## 2020-09-19 DIAGNOSIS — K219 Gastro-esophageal reflux disease without esophagitis: Secondary | ICD-10-CM | POA: Diagnosis not present

## 2020-09-19 DIAGNOSIS — R110 Nausea: Secondary | ICD-10-CM | POA: Diagnosis not present

## 2020-11-27 DIAGNOSIS — I10 Essential (primary) hypertension: Secondary | ICD-10-CM | POA: Diagnosis not present

## 2020-11-27 DIAGNOSIS — E785 Hyperlipidemia, unspecified: Secondary | ICD-10-CM | POA: Diagnosis not present

## 2020-11-27 DIAGNOSIS — E119 Type 2 diabetes mellitus without complications: Secondary | ICD-10-CM | POA: Diagnosis not present

## 2020-11-27 DIAGNOSIS — E1165 Type 2 diabetes mellitus with hyperglycemia: Secondary | ICD-10-CM | POA: Diagnosis not present

## 2020-11-29 DIAGNOSIS — E785 Hyperlipidemia, unspecified: Secondary | ICD-10-CM | POA: Diagnosis not present

## 2020-11-29 DIAGNOSIS — E1169 Type 2 diabetes mellitus with other specified complication: Secondary | ICD-10-CM | POA: Diagnosis not present

## 2020-11-29 DIAGNOSIS — I10 Essential (primary) hypertension: Secondary | ICD-10-CM | POA: Diagnosis not present

## 2020-12-05 DIAGNOSIS — R110 Nausea: Secondary | ICD-10-CM | POA: Diagnosis not present

## 2020-12-05 DIAGNOSIS — Z86010 Personal history of colonic polyps: Secondary | ICD-10-CM | POA: Diagnosis not present

## 2020-12-05 DIAGNOSIS — K5900 Constipation, unspecified: Secondary | ICD-10-CM | POA: Diagnosis not present

## 2020-12-05 DIAGNOSIS — K219 Gastro-esophageal reflux disease without esophagitis: Secondary | ICD-10-CM | POA: Diagnosis not present

## 2020-12-05 DIAGNOSIS — R1310 Dysphagia, unspecified: Secondary | ICD-10-CM | POA: Diagnosis not present

## 2020-12-05 DIAGNOSIS — R634 Abnormal weight loss: Secondary | ICD-10-CM | POA: Diagnosis not present

## 2021-01-15 DIAGNOSIS — K449 Diaphragmatic hernia without obstruction or gangrene: Secondary | ICD-10-CM | POA: Diagnosis not present

## 2021-01-15 DIAGNOSIS — K222 Esophageal obstruction: Secondary | ICD-10-CM | POA: Diagnosis not present

## 2021-01-15 DIAGNOSIS — K3189 Other diseases of stomach and duodenum: Secondary | ICD-10-CM | POA: Diagnosis not present

## 2021-01-15 DIAGNOSIS — K219 Gastro-esophageal reflux disease without esophagitis: Secondary | ICD-10-CM | POA: Diagnosis not present

## 2021-01-15 DIAGNOSIS — R0789 Other chest pain: Secondary | ICD-10-CM | POA: Diagnosis not present

## 2021-01-15 DIAGNOSIS — K2950 Unspecified chronic gastritis without bleeding: Secondary | ICD-10-CM | POA: Diagnosis not present

## 2021-01-15 DIAGNOSIS — R1310 Dysphagia, unspecified: Secondary | ICD-10-CM | POA: Diagnosis not present

## 2021-03-08 DIAGNOSIS — K219 Gastro-esophageal reflux disease without esophagitis: Secondary | ICD-10-CM | POA: Diagnosis not present

## 2021-03-08 DIAGNOSIS — Z86010 Personal history of colonic polyps: Secondary | ICD-10-CM | POA: Diagnosis not present

## 2021-08-04 DIAGNOSIS — Z1231 Encounter for screening mammogram for malignant neoplasm of breast: Secondary | ICD-10-CM | POA: Diagnosis not present

## 2021-08-06 DIAGNOSIS — I10 Essential (primary) hypertension: Secondary | ICD-10-CM | POA: Diagnosis not present

## 2021-08-06 DIAGNOSIS — E1165 Type 2 diabetes mellitus with hyperglycemia: Secondary | ICD-10-CM | POA: Diagnosis not present

## 2021-08-06 DIAGNOSIS — F419 Anxiety disorder, unspecified: Secondary | ICD-10-CM | POA: Diagnosis not present

## 2021-08-08 DIAGNOSIS — I10 Essential (primary) hypertension: Secondary | ICD-10-CM | POA: Diagnosis not present

## 2021-08-08 DIAGNOSIS — E1169 Type 2 diabetes mellitus with other specified complication: Secondary | ICD-10-CM | POA: Diagnosis not present

## 2021-08-08 DIAGNOSIS — Z6830 Body mass index (BMI) 30.0-30.9, adult: Secondary | ICD-10-CM | POA: Diagnosis not present

## 2021-08-08 DIAGNOSIS — E785 Hyperlipidemia, unspecified: Secondary | ICD-10-CM | POA: Diagnosis not present

## 2021-08-10 DIAGNOSIS — R928 Other abnormal and inconclusive findings on diagnostic imaging of breast: Secondary | ICD-10-CM | POA: Diagnosis not present

## 2021-08-10 DIAGNOSIS — N6489 Other specified disorders of breast: Secondary | ICD-10-CM | POA: Diagnosis not present

## 2021-09-08 DIAGNOSIS — E119 Type 2 diabetes mellitus without complications: Secondary | ICD-10-CM | POA: Diagnosis not present

## 2021-10-06 DIAGNOSIS — Z780 Asymptomatic menopausal state: Secondary | ICD-10-CM | POA: Diagnosis not present

## 2021-11-22 DIAGNOSIS — F4381 Prolonged grief disorder: Secondary | ICD-10-CM | POA: Diagnosis not present

## 2021-11-22 DIAGNOSIS — I10 Essential (primary) hypertension: Secondary | ICD-10-CM | POA: Diagnosis not present

## 2021-11-22 DIAGNOSIS — L03113 Cellulitis of right upper limb: Secondary | ICD-10-CM | POA: Diagnosis not present

## 2021-11-22 DIAGNOSIS — E1165 Type 2 diabetes mellitus with hyperglycemia: Secondary | ICD-10-CM | POA: Diagnosis not present

## 2021-11-22 DIAGNOSIS — E1169 Type 2 diabetes mellitus with other specified complication: Secondary | ICD-10-CM | POA: Diagnosis not present

## 2021-11-22 DIAGNOSIS — E782 Mixed hyperlipidemia: Secondary | ICD-10-CM | POA: Diagnosis not present

## 2022-05-16 DIAGNOSIS — E1169 Type 2 diabetes mellitus with other specified complication: Secondary | ICD-10-CM | POA: Diagnosis not present

## 2022-05-16 DIAGNOSIS — F4381 Prolonged grief disorder: Secondary | ICD-10-CM | POA: Diagnosis not present

## 2022-05-17 DIAGNOSIS — E1169 Type 2 diabetes mellitus with other specified complication: Secondary | ICD-10-CM | POA: Diagnosis not present

## 2022-05-17 DIAGNOSIS — E782 Mixed hyperlipidemia: Secondary | ICD-10-CM | POA: Diagnosis not present

## 2022-05-17 DIAGNOSIS — I10 Essential (primary) hypertension: Secondary | ICD-10-CM | POA: Diagnosis not present

## 2022-06-27 DIAGNOSIS — E1169 Type 2 diabetes mellitus with other specified complication: Secondary | ICD-10-CM | POA: Diagnosis not present

## 2022-06-27 DIAGNOSIS — J22 Unspecified acute lower respiratory infection: Secondary | ICD-10-CM | POA: Diagnosis not present

## 2022-06-27 DIAGNOSIS — I10 Essential (primary) hypertension: Secondary | ICD-10-CM | POA: Diagnosis not present

## 2022-06-27 DIAGNOSIS — F4381 Prolonged grief disorder: Secondary | ICD-10-CM | POA: Diagnosis not present

## 2022-07-18 DIAGNOSIS — I10 Essential (primary) hypertension: Secondary | ICD-10-CM | POA: Diagnosis not present

## 2022-07-18 DIAGNOSIS — E785 Hyperlipidemia, unspecified: Secondary | ICD-10-CM | POA: Diagnosis not present

## 2022-07-18 DIAGNOSIS — E1169 Type 2 diabetes mellitus with other specified complication: Secondary | ICD-10-CM | POA: Diagnosis not present

## 2022-08-22 DIAGNOSIS — I10 Essential (primary) hypertension: Secondary | ICD-10-CM | POA: Diagnosis not present

## 2022-08-22 DIAGNOSIS — F4381 Prolonged grief disorder: Secondary | ICD-10-CM | POA: Diagnosis not present

## 2022-08-22 DIAGNOSIS — E1169 Type 2 diabetes mellitus with other specified complication: Secondary | ICD-10-CM | POA: Diagnosis not present

## 2022-08-22 DIAGNOSIS — F5102 Adjustment insomnia: Secondary | ICD-10-CM | POA: Diagnosis not present

## 2022-09-18 DIAGNOSIS — R0689 Other abnormalities of breathing: Secondary | ICD-10-CM | POA: Diagnosis not present

## 2022-09-18 DIAGNOSIS — J1000 Influenza due to other identified influenza virus with unspecified type of pneumonia: Secondary | ICD-10-CM | POA: Diagnosis not present

## 2022-09-18 DIAGNOSIS — J019 Acute sinusitis, unspecified: Secondary | ICD-10-CM | POA: Diagnosis not present

## 2022-09-18 DIAGNOSIS — R051 Acute cough: Secondary | ICD-10-CM | POA: Diagnosis not present

## 2022-12-05 DIAGNOSIS — E7800 Pure hypercholesterolemia, unspecified: Secondary | ICD-10-CM | POA: Diagnosis not present

## 2022-12-05 DIAGNOSIS — E782 Mixed hyperlipidemia: Secondary | ICD-10-CM | POA: Diagnosis not present

## 2022-12-05 DIAGNOSIS — I10 Essential (primary) hypertension: Secondary | ICD-10-CM | POA: Diagnosis not present

## 2022-12-05 DIAGNOSIS — F4381 Prolonged grief disorder: Secondary | ICD-10-CM | POA: Diagnosis not present

## 2022-12-05 DIAGNOSIS — E1169 Type 2 diabetes mellitus with other specified complication: Secondary | ICD-10-CM | POA: Diagnosis not present

## 2022-12-28 DIAGNOSIS — Z1231 Encounter for screening mammogram for malignant neoplasm of breast: Secondary | ICD-10-CM | POA: Diagnosis not present

## 2023-01-15 DIAGNOSIS — E782 Mixed hyperlipidemia: Secondary | ICD-10-CM | POA: Diagnosis not present

## 2023-01-15 DIAGNOSIS — E1169 Type 2 diabetes mellitus with other specified complication: Secondary | ICD-10-CM | POA: Diagnosis not present

## 2023-01-15 DIAGNOSIS — I10 Essential (primary) hypertension: Secondary | ICD-10-CM | POA: Diagnosis not present

## 2023-01-22 DIAGNOSIS — H5213 Myopia, bilateral: Secondary | ICD-10-CM | POA: Diagnosis not present

## 2023-01-22 DIAGNOSIS — E113393 Type 2 diabetes mellitus with moderate nonproliferative diabetic retinopathy without macular edema, bilateral: Secondary | ICD-10-CM | POA: Diagnosis not present

## 2023-01-22 DIAGNOSIS — H10423 Simple chronic conjunctivitis, bilateral: Secondary | ICD-10-CM | POA: Diagnosis not present

## 2023-01-22 DIAGNOSIS — H524 Presbyopia: Secondary | ICD-10-CM | POA: Diagnosis not present

## 2023-01-22 DIAGNOSIS — H25813 Combined forms of age-related cataract, bilateral: Secondary | ICD-10-CM | POA: Diagnosis not present

## 2023-01-22 DIAGNOSIS — H52223 Regular astigmatism, bilateral: Secondary | ICD-10-CM | POA: Diagnosis not present

## 2023-05-28 DIAGNOSIS — E113393 Type 2 diabetes mellitus with moderate nonproliferative diabetic retinopathy without macular edema, bilateral: Secondary | ICD-10-CM | POA: Diagnosis not present

## 2023-08-20 DIAGNOSIS — E113393 Type 2 diabetes mellitus with moderate nonproliferative diabetic retinopathy without macular edema, bilateral: Secondary | ICD-10-CM | POA: Diagnosis not present

## 2023-10-03 ENCOUNTER — Telehealth: Payer: Self-pay

## 2023-10-03 NOTE — Progress Notes (Signed)
   10/03/2023  Patient ID: Sherry Aguilar, female   DOB: 12/06/1955, 68 y.o.   MRN: [id number]  Contacted patient regarding diabetes from a quality report for [facility]. Patient was on [facility] roster for A1c > 8.   Left patient a voicemail to return my call at their convenience  Livia Riffle, PharmD Clinical Pharmacist  [phone]

## 2023-10-16 NOTE — Progress Notes (Signed)
   10/16/2023  Patient ID: Sherry Aguilar, female   DOB: August 30, 1955, 68 y.o.   MRN: [id number]  Contacted patient regarding diabetes from a quality report for [facility]. Patient was on [facility] DM roster for A1c > 8.   Spoke to patient, she reports needing reattribution as she lives in [place], [place] and no longer commutes to [place] for work.   She did not report any immediate medication needs that I could assist with at this time. I will close out this case for diabetes management.   Livia Riffle, PharmD Clinical Pharmacist  [phone]

## 2023-11-26 DIAGNOSIS — L988 Other specified disorders of the skin and subcutaneous tissue: Secondary | ICD-10-CM | POA: Diagnosis not present

## 2023-11-26 DIAGNOSIS — L03811 Cellulitis of head [any part, except face]: Secondary | ICD-10-CM | POA: Diagnosis not present

## 2023-11-26 DIAGNOSIS — E119 Type 2 diabetes mellitus without complications: Secondary | ICD-10-CM | POA: Diagnosis not present

## 2023-12-22 ENCOUNTER — Ambulatory Visit (INDEPENDENT_AMBULATORY_CARE_PROVIDER_SITE_OTHER): Payer: Self-pay | Admitting: Family Medicine

## 2023-12-22 ENCOUNTER — Encounter: Payer: Self-pay | Admitting: Family Medicine

## 2023-12-22 VITALS — BP 182/76 | HR 74 | Resp 16 | Ht 66.5 in | Wt 181.1 lb

## 2023-12-22 DIAGNOSIS — E7849 Other hyperlipidemia: Secondary | ICD-10-CM | POA: Diagnosis not present

## 2023-12-22 DIAGNOSIS — Z1159 Encounter for screening for other viral diseases: Secondary | ICD-10-CM

## 2023-12-22 DIAGNOSIS — E1165 Type 2 diabetes mellitus with hyperglycemia: Secondary | ICD-10-CM

## 2023-12-22 DIAGNOSIS — Z794 Long term (current) use of insulin: Secondary | ICD-10-CM

## 2023-12-22 DIAGNOSIS — E559 Vitamin D deficiency, unspecified: Secondary | ICD-10-CM

## 2023-12-22 DIAGNOSIS — Z114 Encounter for screening for human immunodeficiency virus [HIV]: Secondary | ICD-10-CM | POA: Diagnosis not present

## 2023-12-22 DIAGNOSIS — I10 Essential (primary) hypertension: Secondary | ICD-10-CM | POA: Diagnosis not present

## 2023-12-22 DIAGNOSIS — E038 Other specified hypothyroidism: Secondary | ICD-10-CM | POA: Diagnosis not present

## 2023-12-22 MED ORDER — METFORMIN HCL ER 500 MG PO TB24: 500.0000 mg | ORAL_TABLET | Freq: Two times a day (BID) | ORAL | 0 refills | Status: DC

## 2023-12-22 MED ORDER — OLMESARTAN-AMLODIPINE-HCTZ 40-10-12.5 MG PO TABS: 1.0000 | ORAL_TABLET | Freq: Every day | ORAL | 1 refills | Status: DC

## 2023-12-22 MED ORDER — FREESTYLE LIBRE 3 SENSOR MISC: 2 refills | Status: DC

## 2023-12-22 NOTE — Progress Notes (Signed)
 New Patient Office Visit  Subjective:  Patient ID: Sherry Aguilar, female    DOB: 24-Dec-1955  Age: 68 y.o. MRN: [id number]  CC:  Chief Complaint  Patient presents with   Establish Care    New pt appt     HPI Sherry Aguilar is a 68 y.o. female with past medical history of hypertension, type II diabetes as presents for establishing care.   Hypertension:The patient presents for follow-up of hypertension. She reports that she has not been taking her antihypertensive medications for approximately one month. Her previous regimen included Amlodipine  10 mg daily, Spironolactone 25 mg daily, and Doxazosin 1 mg at bedtime. She reports experiencing mild headaches, but denies dizziness, visual changes, chest pain, or shortness of breath. .  Type II diabetes: The patient presents for evaluation of Type 2 diabetes. She reports that she has not taken her blood sugar medications for the past month. She monitors her blood glucose three times daily and at bedtime, with readings ranging from highs over 300 mg/dL to lows of 50 mg/dL.She is concerned that her insulin pump may not be delivering the correct dosage. She states that she has been using the pump intermittently over the past month, rather than consistently as usual, due to concerns about its functionality and poor condition. She has been using an insulin pump for the past 15 years and currently has one at home. The patient reports classic hyperglycemia symptoms, including frequent urination (polyuria), increased thirst (polydipsia), and increased hunger (polyphagia).     Past Medical History:  Diagnosis Date   Cystitis 2017   Diabetes mellitus without complication (HCC)    Hypertension     Past Surgical History:  Procedure Laterality Date   ABDOMINAL HYSTERECTOMY      Family History  Problem Relation Age of Onset   Diabetes Mother    Heart failure Mother    Diabetes Father    Stroke Father    Diabetes Sister    Diabetes Brother     Thyroid disease Neg Hx     Social History   Socioeconomic History   Marital status: Married    Spouse name: Not on file   Number of children: Not on file   Years of education: Not on file   Highest education level: Not on file  Occupational History   Not on file  Tobacco Use   Smoking status: Former   Smokeless tobacco: Never  Substance and Sexual Activity   Alcohol use: Not on file   Drug use: Yes    Types: Marijuana   Sexual activity: Not on file  Other Topics Concern   Not on file  Social History Narrative   Not on file   Social Drivers of Health   Financial Resource Strain: Low Risk  (12/22/2023)   Overall Financial Resource Strain (CARDIA)    Difficulty of Paying Living Expenses: Not hard at all  Food Insecurity: No Food Insecurity (12/22/2023)   Hunger Vital Sign    Worried About Running Out of Food in the Last Year: Never true    Ran Out of Food in the Last Year: Never true  Transportation Needs: No Transportation Needs (12/22/2023)   PRAPARE - Administrator, Civil Service (Medical): No    Lack of Transportation (Non-Medical): No  Physical Activity: Inactive (12/22/2023)   Exercise Vital Sign    Days of Exercise per Week: 0 days    Minutes of Exercise per Session: 0 min  Stress: No Stress  Concern Present (12/22/2023)   [employer] of Occupational Health - Occupational Stress Questionnaire    Feeling of Stress: Only a little  Social Connections: Moderately Integrated (12/22/2023)   Social Connection and Isolation Panel    Frequency of Communication with Friends and Family: More than three times a week    Frequency of Social Gatherings with Friends and Family: More than three times a week    Attends Religious Services: More than 4 times per year    Active Member of [employer] or Organizations: Yes    Attends Banker Meetings: More than 4 times per year    Marital Status: Widowed  Intimate Partner Violence: Not At Risk (12/22/2023)   Humiliation,  Afraid, Rape, and Kick questionnaire    Fear of Current or Ex-Partner: No    Emotionally Abused: No    Physically Abused: No    Sexually Abused: No    ROS Review of Systems  Constitutional:  Negative for chills and fever.  Eyes:  Negative for visual disturbance.  Respiratory:  Negative for chest tightness and shortness of breath.   Endocrine: Positive for polydipsia, polyphagia and polyuria.  Neurological:  Negative for dizziness and headaches.    Objective:   Today's Vitals: BP (!) 182/76   Pulse 74   Resp 16   Ht 5' 6.5 (1.689 m)   Wt 181 lb 1.9 oz (82.2 kg)   LMP 03/31/2011   SpO2 97%   BMI 28.80 kg/m   Physical Exam HENT:     Head: Normocephalic.     Mouth/Throat:     Mouth: Mucous membranes are moist.  Cardiovascular:     Rate and Rhythm: Normal rate.     Heart sounds: Normal heart sounds.  Pulmonary:     Effort: Pulmonary effort is normal.     Breath sounds: Normal breath sounds.  Neurological:     Mental Status: She is alert.      Assessment & Plan:   Primary hypertension Assessment & Plan: The patient presented with uncontrolled blood pressure in the clinic today. As a result, her previous antihypertensive regimen will be discontinued, and she will begin treatment with Olmesartan -Amlodipine -Hydrochlorothiazide 40-10-12.5 mg once daily. She was advised to adopt a low-sodium diet and increase physical activity to support blood pressure control. We discussed the long-term risks associated with uncontrolled hypertension, including increased risk of stroke, coronary artery disease, and heart failure. The patient was also instructed to seek immediate medical attention if her blood pressure exceeds 180/120 mmHg and is accompanied by symptoms such as headaches, chest pain, palpitations, blurred vision, or dizziness.  BP Readings from Last 3 Encounters:  12/22/23 (!) 182/76  04/08/17 116/76  02/27/17 136/90     Orders: -     Olmesartan -amLODIPine -HCTZ; Take 1  tablet by mouth daily.  Dispense: 30 tablet; Refill: 1  Uncontrolled type 2 diabetes mellitus with hyperglycemia, with long-term current use of insulin (HCC) Assessment & Plan: -A referral has been placed to Endocrinology for specialized management of the patient's insulin therapy. -Encouraged to please resume Metformin  500 mg twice daily as previously prescribed. -I  strongly encourage to reduce intake of high-sugar foods and beverages and increase your level of physical activity, as these lifestyle changes can significantly improve blood glucose control. -I  will consider starting her on Ozempic or Mounjaro  after reviewing her most recent lab results. -Encouraged to seek urgent medical care if your blood glucose exceeds 300 mg/dL and you experience symptoms of diabetic ketoacidosis, such as: -Nausea  or vomiting -Abdominal pain -Confusion -Fruity-smelling breath -Rapid or deep breathing  Please monitor your blood glucose regularly and follow up as scheduled.  Orders: -     Hemoglobin A1c -     Microalbumin / creatinine urine ratio -     Ambulatory referral to Endocrinology -     metFORMIN  HCl ER; Take 1 tablet (500 mg total) by mouth 2 (two) times daily with a meal.  Dispense: 120 tablet; Refill: 0 -     FreeStyle Libre 3 Sensor; Place 1 sensor on the skin every 14 days. Use to check glucose continuously  Dispense: 6 each; Refill: 2  Vitamin D  deficiency -     VITAMIN D  25 Hydroxy (Vit-D Deficiency, Fractures)  Need for hepatitis C screening test -     Hepatitis C antibody  Encounter for screening for HIV -     HIV Antibody (routine testing w rflx)  TSH (thyroid-stimulating hormone deficiency) -     TSH + free T4  Other hyperlipidemia -     Lipid panel -     CMP14+EGFR -     CBC with Differential/Platelet   Note: This chart has been completed using Engineer, civil (consulting) software, and while attempts have been made to ensure accuracy, certain words and phrases may not be  transcribed as intended.    Follow-up: Return in about 1 month (around 01/22/2024).   Valdez Brannan, FNP

## 2023-12-22 NOTE — Assessment & Plan Note (Addendum)
 The patient presented with uncontrolled blood pressure in the clinic today. As a result, her previous antihypertensive regimen will be discontinued, and she will begin treatment with Olmesartan -Amlodipine -Hydrochlorothiazide 40-10-12.5 mg once daily. She was advised to adopt a low-sodium diet and increase physical activity to support blood pressure control. We discussed the long-term risks associated with uncontrolled hypertension, including increased risk of stroke, coronary artery disease, and heart failure. The patient was also instructed to seek immediate medical attention if her blood pressure exceeds 180/120 mmHg and is accompanied by symptoms such as headaches, chest pain, palpitations, blurred vision, or dizziness.  BP Readings from Last 3 Encounters:  12/22/23 (!) 182/76  04/08/17 116/76  02/27/17 136/90

## 2023-12-22 NOTE — Assessment & Plan Note (Signed)
-  A referral has been placed to Endocrinology for specialized management of the patient's insulin therapy. -Encouraged to please resume Metformin  500 mg twice daily as previously prescribed. -I  strongly encourage to reduce intake of high-sugar foods and beverages and increase your level of physical activity, as these lifestyle changes can significantly improve blood glucose control. -I  will consider starting her on Ozempic or Mounjaro  after reviewing her most recent lab results. -Encouraged to seek urgent medical care if your blood glucose exceeds 300 mg/dL and you experience symptoms of diabetic ketoacidosis, such as: -Nausea or vomiting -Abdominal pain -Confusion -Fruity-smelling breath -Rapid or deep breathing  Please monitor your blood glucose regularly and follow up as scheduled.

## 2023-12-22 NOTE — Patient Instructions (Addendum)
 I appreciate the opportunity to provide care to you today!    Follow up:  1 months  Labs: please stop by the lab today to get your blood drawn (CBC, CMP, TSH, Lipid profile, HgA1c, Vit D)  Screening: HIV and Hep C  Schedule medicare annual wellness visit  Type 2 diabetes -A referral has been placed to Endocrinology for specialized management of your insulin therapy. -Please resume Metformin  500 mg twice daily as previously prescribed. -We strongly encourage you to reduce intake of high-sugar foods and beverages and increase your level of physical activity, as these lifestyle changes can significantly improve blood glucose control. -We will consider starting you on Ozempic or Mounjaro  after reviewing your most recent lab results. -Seek urgent medical care if your blood glucose exceeds 300 mg/dL and you experience symptoms of diabetic ketoacidosis, such as: -Nausea or vomiting -Abdominal pain -Confusion -Fruity-smelling breath -Rapid or deep breathing  Please monitor your blood glucose regularly and follow up as scheduled.   Hypertension Management  Your current blood pressure is above the target goal of <140/90 mmHg. To address this, please start taking Olmesartan -Amlodipine -Hydrochlorothiazide 40-10-12.5 mg once daily as prescribed. This medication combines three agents to help lower your blood pressure effectively. Please take it consistently at the same time each day and monitor your blood pressure regularly.  Medication Instructions: Take your blood pressure medication at the same time each day. After taking your medication, check your blood pressure at least an hour later. If your first reading is >140/90 mmHg, wait at least 10 minutes and recheck your blood pressure. Side Effects: In the initial days of therapy, you may experience dizziness or lightheadedness as your body adjusts to the lower blood pressure; this is expected. Diet and Lifestyle: Adhere to a low-sodium diet,  limiting intake to less than 1500 mg daily, and increase your physical activity. Avoid over-the-counter NSAIDs such as ibuprofen and naproxen while on this medication. Hydration and Nutrition: Stay well-hydrated by drinking at least 64 ounces of water daily. Increase your servings of fruits and vegetables and avoid excessive sodium in your diet. Long-Term Considerations: Uncontrolled hypertension can increase the risk of cardiovascular diseases, including stroke, coronary artery disease, and heart failure.  Please report to the emergency department if your blood pressure exceeds 180/120 and is accompanied by symptoms such as headaches, chest pain, palpitations, blurred vision, or dizziness.    Please follow up if your symptoms worsen or fail to improve.  Referrals today-  Endocrinology   Attached with your AVS, you will find valuable resources for self-education. I highly recommend dedicating some time to thoroughly examine them.   Please continue to a heart-healthy diet and increase your physical activities. Try to exercise for at least five days a week.    It was a pleasure to see you and I look forward to continuing to work together on your health and well-being. Please do not hesitate to call the office if you need care or have questions about your care.  In case of emergency, please visit the Emergency Department for urgent care, or contact our clinic at [phone] to schedule an appointment. We're here to help you!   Have a wonderful day and week. With Gratitude, Osker Ayoub MSN, FNP-BC

## 2023-12-25 ENCOUNTER — Ambulatory Visit: Payer: Self-pay | Admitting: Family Medicine

## 2023-12-25 DIAGNOSIS — E1165 Type 2 diabetes mellitus with hyperglycemia: Secondary | ICD-10-CM

## 2023-12-25 DIAGNOSIS — E7849 Other hyperlipidemia: Secondary | ICD-10-CM

## 2023-12-25 DIAGNOSIS — E559 Vitamin D deficiency, unspecified: Secondary | ICD-10-CM

## 2023-12-25 MED ORDER — VITAMIN D (ERGOCALCIFEROL) 1.25 MG (50000 UNIT) PO CAPS: 50000.0000 [IU] | ORAL_CAPSULE | ORAL | 1 refills | Status: DC

## 2023-12-25 MED ORDER — ROSUVASTATIN CALCIUM 10 MG PO TABS: 10.0000 mg | ORAL_TABLET | Freq: Every day | ORAL | 1 refills | Status: DC

## 2023-12-26 LAB — HEPATITIS C ANTIBODY: Hep C Virus Ab: NONREACTIVE

## 2023-12-26 LAB — CMP14+EGFR
ALT: 10 IU/L (ref 0–32)
AST: 15 IU/L (ref 0–40)
Albumin: 4.3 g/dL (ref 3.9–4.9)
Alkaline Phosphatase: 112 IU/L (ref 44–121)
BUN/Creatinine Ratio: 19 (ref 12–28)
BUN: 15 mg/dL (ref 8–27)
Bilirubin Total: 0.3 mg/dL (ref 0.0–1.2)
CO2: 21 mmol/L (ref 20–29)
Calcium: 9.5 mg/dL (ref 8.7–10.3)
Chloride: 102 mmol/L (ref 96–106)
Creatinine, Ser: 0.77 mg/dL (ref 0.57–1.00)
Globulin, Total: 2.2 g/dL (ref 1.5–4.5)
Glucose: 173 mg/dL — ABNORMAL HIGH (ref 70–99)
Potassium: 4.4 mmol/L (ref 3.5–5.2)
Sodium: 139 mmol/L (ref 134–144)
Total Protein: 6.5 g/dL (ref 6.0–8.5)
eGFR: 83 mL/min/1.73 (ref >59)

## 2023-12-26 LAB — CBC WITH DIFFERENTIAL/PLATELET
Basophils Absolute: 0 x10E3/uL (ref 0.0–0.2)
Basos: 1 %
EOS (ABSOLUTE): 0.1 x10E3/uL (ref 0.0–0.4)
Eos: 3 %
Hematocrit: 43.9 % (ref 34.0–46.6)
Hemoglobin: 13.5 g/dL (ref 11.1–15.9)
Immature Grans (Abs): 0 x10E3/uL (ref 0.0–0.1)
Immature Granulocytes: 0 %
Lymphocytes Absolute: 1.8 x10E3/uL (ref 0.7–3.1)
Lymphs: 42 %
MCH: 28 pg (ref 26.6–33.0)
MCHC: 30.8 g/dL — ABNORMAL LOW (ref 31.5–35.7)
MCV: 91 fL (ref 79–97)
Monocytes Absolute: 0.4 x10E3/uL (ref 0.1–0.9)
Monocytes: 8 %
Neutrophils Absolute: 2 x10E3/uL (ref 1.4–7.0)
Neutrophils: 46 %
Platelets: 241 x10E3/uL (ref 150–450)
RBC: 4.83 x10E6/uL (ref 3.77–5.28)
RDW: 12.2 % (ref 11.7–15.4)
WBC: 4.3 x10E3/uL (ref 3.4–10.8)

## 2023-12-26 LAB — HEMOGLOBIN A1C
Est. average glucose Bld gHb Est-mCnc: >398 mg/dL
Hgb A1c MFr Bld: >15.5 — AB (ref 4.8–5.6)

## 2023-12-26 LAB — LIPID PANEL
Chol/HDL Ratio: 3.7 ratio (ref 0.0–4.4)
Cholesterol, Total: 216 mg/dL — ABNORMAL HIGH (ref 100–199)
HDL: 58 mg/dL (ref >39)
LDL Chol Calc (NIH): 141 mg/dL — ABNORMAL HIGH (ref 0–99)
Triglycerides: 97 mg/dL (ref 0–149)
VLDL Cholesterol Cal: 17 mg/dL (ref 5–40)

## 2023-12-26 LAB — HIV ANTIBODY (ROUTINE TESTING W REFLEX): HIV Screen 4th Generation wRfx: NONREACTIVE

## 2023-12-26 LAB — VITAMIN D 25 HYDROXY (VIT D DEFICIENCY, FRACTURES): Vit D, 25-Hydroxy: 23.2 ng/mL — ABNORMAL LOW (ref 30.0–100.0)

## 2023-12-26 LAB — TSH+FREE T4
Free T4: 1.12 ng/dL (ref 0.82–1.77)
TSH: 1.36 u[IU]/mL (ref 0.450–4.500)

## 2023-12-26 MED ORDER — BLOOD GLUCOSE TEST VI STRP: 1.0000 | ORAL_STRIP | Freq: Three times a day (TID) | 0 refills | Status: DC

## 2023-12-26 MED ORDER — LANCET DEVICE MISC: 1.0000 | Freq: Three times a day (TID) | 0 refills | Status: AC

## 2023-12-26 MED ORDER — VITAMIN D (ERGOCALCIFEROL) 1.25 MG (50000 UNIT) PO CAPS: 50000.0000 [IU] | ORAL_CAPSULE | ORAL | 1 refills | Status: AC

## 2023-12-26 MED ORDER — TIRZEPATIDE 2.5 MG/0.5ML ~~LOC~~ SOAJ: 2.5000 mg | SUBCUTANEOUS | 0 refills | Status: DC

## 2023-12-26 MED ORDER — LANCETS MISC. MISC
1.0000 | Freq: Three times a day (TID) | 0 refills | Status: AC
Start: 2023-12-26 — End: 2024-01-25

## 2023-12-26 MED ORDER — BLOOD GLUCOSE MONITORING SUPPL DEVI: 1.0000 | Freq: Three times a day (TID) | 0 refills | Status: AC

## 2023-12-31 DIAGNOSIS — E113393 Type 2 diabetes mellitus with moderate nonproliferative diabetic retinopathy without macular edema, bilateral: Secondary | ICD-10-CM | POA: Diagnosis not present

## 2023-12-31 LAB — HM DIABETES EYE EXAM

## 2024-01-06 ENCOUNTER — Other Ambulatory Visit: Payer: Self-pay | Admitting: Family Medicine

## 2024-01-06 ENCOUNTER — Encounter: Payer: Self-pay | Admitting: Family Medicine

## 2024-01-28 ENCOUNTER — Encounter: Payer: Self-pay | Admitting: "Endocrinology

## 2024-01-28 ENCOUNTER — Ambulatory Visit: Payer: Self-pay | Admitting: "Endocrinology

## 2024-01-28 VITALS — BP 112/64 | HR 80 | Ht 66.5 in | Wt 180.6 lb

## 2024-01-28 DIAGNOSIS — E7849 Other hyperlipidemia: Secondary | ICD-10-CM | POA: Diagnosis not present

## 2024-01-28 DIAGNOSIS — I10 Essential (primary) hypertension: Secondary | ICD-10-CM

## 2024-01-28 DIAGNOSIS — E1165 Type 2 diabetes mellitus with hyperglycemia: Secondary | ICD-10-CM

## 2024-01-28 DIAGNOSIS — F17200 Nicotine dependence, unspecified, uncomplicated: Secondary | ICD-10-CM | POA: Insufficient documentation

## 2024-01-28 DIAGNOSIS — Z794 Long term (current) use of insulin: Secondary | ICD-10-CM | POA: Insufficient documentation

## 2024-01-28 MED ORDER — ROSUVASTATIN CALCIUM 20 MG PO TABS
20.0000 mg | ORAL_TABLET | Freq: Every day | ORAL | 1 refills | Status: AC
  Filled 2024-05-17 – 2024-05-18 (×3): qty 90, 90d supply, fill #0

## 2024-01-28 MED ORDER — ACCU-CHEK GUIDE ME W/DEVICE KIT: 1.0000 | PACK | 0 refills | Status: AC

## 2024-01-28 MED ORDER — ACCU-CHEK GUIDE TEST VI STRP: ORAL_STRIP | 2 refills | Status: AC

## 2024-01-28 MED ORDER — INSULIN PEN NEEDLE 29G X 5MM MISC: 1 refills | Status: AC

## 2024-01-28 MED ORDER — TRESIBA FLEXTOUCH 200 UNIT/ML ~~LOC~~ SOPN: 50.0000 [IU] | PEN_INJECTOR | Freq: Every day | SUBCUTANEOUS | 1 refills | Status: DC

## 2024-01-28 MED ORDER — GLIPIZIDE ER 5 MG PO TB24
5.0000 mg | ORAL_TABLET | Freq: Every day | ORAL | 1 refills | Status: AC
  Filled 2024-05-17 – 2024-05-18 (×3): qty 90, 90d supply, fill #0

## 2024-01-28 NOTE — Patient Instructions (Signed)

## 2024-01-28 NOTE — Progress Notes (Signed)
 Endocrinology Consult Note       01/28/2024, 3:10 PM   Subjective:    Patient ID: Sherry Aguilar, female    DOB: 03-25-1956.  Sherry Aguilar is being seen in consultation for management of currently uncontrolled symptomatic diabetes requested by  Zarwolo, Gloria, FNP.   Past Medical History:  Diagnosis Date   Cystitis 2017   Diabetes mellitus without complication (HCC)    Hypertension     Past Surgical History:  Procedure Laterality Date   ABDOMINAL HYSTERECTOMY      Social History   Socioeconomic History   Marital status: Widowed    Spouse name: Not on file   Number of children: Not on file   Years of education: Not on file   Highest education level: Not on file  Occupational History   Not on file  Tobacco Use   Smoking status: Former   Smokeless tobacco: Never  Substance and Sexual Activity   Alcohol use: Yes    Comment: occasional   Drug use: Yes    Types: Marijuana   Sexual activity: Not on file  Other Topics Concern   Not on file  Social History Narrative   Not on file   Social Drivers of Health   Financial Resource Strain: Low Risk  (12/22/2023)   Overall Financial Resource Strain (CARDIA)    Difficulty of Paying Living Expenses: Not hard at all  Food Insecurity: No Food Insecurity (12/22/2023)   Hunger Vital Sign    Worried About Running Out of Food in the Last Year: Never true    Ran Out of Food in the Last Year: Never true  Transportation Needs: No Transportation Needs (12/22/2023)   PRAPARE - Administrator, Civil Service (Medical): No    Lack of Transportation (Non-Medical): No  Physical Activity: Inactive (12/22/2023)   Exercise Vital Sign    Days of Exercise per Week: 0 days    Minutes of Exercise per Session: 0 min  Stress: No Stress Concern Present (12/22/2023)   [employer] of Occupational Health - Occupational Stress Questionnaire    Feeling of  Stress: Only a little  Social Connections: Moderately Integrated (12/22/2023)   Social Connection and Isolation Panel    Frequency of Communication with Friends and Family: More than three times a week    Frequency of Social Gatherings with Friends and Family: More than three times a week    Attends Religious Services: More than 4 times per year    Active Member of [employer] or Organizations: Yes    Attends Banker Meetings: More than 4 times per year    Marital Status: Widowed    Family History  Problem Relation Age of Onset   Diabetes Mother    Heart failure Mother    Diabetes Father    Stroke Father    Diabetes Sister    Diabetes Brother    Thyroid disease Neg Hx     Outpatient Encounter Medications as of 01/28/2024  Medication Sig   Blood Glucose Monitoring Suppl (ACCU-CHEK GUIDE ME) w/Device KIT 1 Piece by Does not apply route as directed.  glipiZIDE  (GLUCOTROL  XL) 5 MG 24 hr tablet Take 1 tablet (5 mg total) by mouth daily with breakfast.   glucose blood (ACCU-CHEK GUIDE TEST) test strip Use to monitor glucose 4 times daily  as instructed   insulin  degludec (TRESIBA  FLEXTOUCH) 200 UNIT/ML FlexTouch Pen Inject 50 Units into the skin at bedtime.   Insulin  Pen Needle 29G X MISC Use to inject insulin  daily   MAGNESIUM CITRATE PO Take 1 tablet by mouth daily.   Blood Glucose Monitoring Suppl DEVI 1 each by Does not apply route in the morning, at noon, and at bedtime. May substitute to any manufacturer covered by patient's insurance.   Continuous Glucose Sensor (FREESTYLE LIBRE 3 SENSOR) MISC Place 1 sensor on the skin every 14 days. Use to check glucose continuously   Doxazosin Mesylate (CARDURA PO) doxazosin takes 1 mg at bedtime (Patient not taking: Reported on 01/28/2024)   HUMALOG 100 UNIT/ML injection Inject 1 Units into the skin daily. Hold until next visit   metFORMIN  (GLUCOPHAGE -XR) 500 MG 24 hr tablet Take 1 tablet (500 mg total) by mouth 2 (two) times daily with  a meal.   Olmesartan -amLODIPine -HCTZ 40-10-12.5 MG TABS Take 1 tablet by mouth daily.   rosuvastatin  (CRESTOR ) 20 MG tablet Take 1 tablet (20 mg total) by mouth at bedtime.   Vitamin D , Ergocalciferol , (DRISDOL ) 1.25 MG (50000 UNIT) CAPS capsule Take 1 capsule (50,000 Units total) by mouth every 7 (seven) days.   ZORVOLEX 35 MG CAPS Take 1 tablet by mouth 3 (three) times daily.  (Patient not taking: Reported on 12/22/2023)   [DISCONTINUED] Glucose Blood (BLOOD GLUCOSE TEST STRIPS) STRP 1 each by In Vitro route in the morning, at noon, and at bedtime. May substitute to any manufacturer covered by patient's insurance.   [DISCONTINUED] rosuvastatin  (CRESTOR ) 10 MG tablet Take 1 tablet (10 mg total) by mouth daily. (Patient not taking: Reported on 01/28/2024)   [DISCONTINUED] tirzepatide  (MOUNJARO ) 2.5 MG/0.5ML Pen Inject 2.5 mg into the skin once a week. (Patient not taking: Reported on 01/28/2024)   No facility-administered encounter medications on file as of 01/28/2024.    ALLERGIES: Allergies  Allergen Reactions   Pecan Nut (Diagnostic) Anaphylaxis    Tree nut allergy   Nitroglycerin Nausea And Vomiting   Other     Raw fruit    VACCINATION STATUS:  There is no immunization history on file for this patient.  Diabetes She presents for her initial diabetic visit. She has type 2 diabetes mellitus. Onset time: She was diagnosed at approximate age of 48 years. There are no hypoglycemic associated symptoms. Pertinent negatives for hypoglycemia include no confusion, headaches, pallor or seizures. Associated symptoms include fatigue, polydipsia and polyuria. Pertinent negatives for diabetes include no chest pain and no polyphagia. There are no hypoglycemic complications. Symptoms are worsening. Diabetic complications include retinopathy. (Persistent hyperglycemia with recent A1c of 15.5%, comorbid hypertension and hyperlipidemia.) Risk factors for coronary artery disease include dyslipidemia,  post-menopausal, tobacco exposure, sedentary lifestyle, hypertension and diabetes mellitus. Current diabetic treatment includes insulin  injections (She has a faulty insulin  pump which was supposed to deliver 50 units of insulin  daily, however admittedly patient did have problems with not delivering insulin  properly and leaking.  She did not bring it with her.). Her weight is fluctuating minimally. She is following a generally unhealthy diet. When asked about meal planning, she reported none. She rarely participates in exercise. Her overall blood glucose range is >200 mg/dl. (On December 22, 2023 her A1c was >15.5%.  She  did not bring any logs nor meter to review.) An ACE inhibitor/angiotensin II receptor blocker is being taken. Eye exam is current.  Hyperlipidemia This is a chronic problem. The problem is uncontrolled. Exacerbating diseases include diabetes. Pertinent negatives include no chest pain, myalgias or shortness of breath. Current antihyperlipidemic treatment includes statins. Risk factors for coronary artery disease include diabetes mellitus, dyslipidemia, hypertension, a sedentary lifestyle and post-menopausal.  Hypertension This is a chronic problem. The current episode started more than 1 year ago. The problem is controlled. Pertinent negatives include no chest pain, headaches, palpitations or shortness of breath. Risk factors for coronary artery disease include dyslipidemia, diabetes mellitus, post-menopausal state, sedentary lifestyle and smoking/tobacco exposure. Past treatments include angiotensin blockers. Hypertensive end-organ damage includes retinopathy.     Review of Systems  Constitutional:  Positive for fatigue. Negative for chills, fever and unexpected weight change.  HENT:  Negative for trouble swallowing and voice change.   Eyes:  Negative for visual disturbance.  Respiratory:  Negative for cough, shortness of breath and wheezing.   Cardiovascular:  Negative for chest pain,  palpitations and leg swelling.  Gastrointestinal:  Negative for diarrhea, nausea and vomiting.  Endocrine: Positive for polydipsia and polyuria. Negative for cold intolerance, heat intolerance and polyphagia.  Musculoskeletal:  Negative for arthralgias and myalgias.  Skin:  Negative for color change, pallor, rash and wound.  Neurological:  Negative for seizures and headaches.  Psychiatric/Behavioral:  Negative for confusion and suicidal ideas.     Objective:       01/28/2024    2:20 PM 12/22/2023   10:19 AM 04/08/2017   11:06 AM  Vitals with BMI  Height 5' 6.5 5' 6.5 5' 6  Weight 180 lbs 10 oz 181 lbs 2 oz 219 lbs 13 oz  BMI 28.72 28.8 35.49  Systolic 112 182 883  Diastolic 64 76 76  Pulse 80 74 70    BP 112/64   Pulse 80   Ht 5' 6.5 (1.689 m)   Wt 180 lb 9.6 oz (81.9 kg)   LMP 03/31/2011   BMI 28.71 kg/m   Wt Readings from Last 3 Encounters:  01/28/24 180 lb 9.6 oz (81.9 kg)  12/22/23 181 lb 1.9 oz (82.2 kg)  04/08/17 219 lb 12.8 oz (99.7 kg)     Physical Exam Constitutional:      Appearance: She is well-developed.  HENT:     Head: Normocephalic and atraumatic.  Neck:     Thyroid: No thyromegaly.     Trachea: No tracheal deviation.  Cardiovascular:     Rate and Rhythm: Normal rate and regular rhythm.  Pulmonary:     Effort: Pulmonary effort is normal.     Breath sounds: Wheezing present.  Abdominal:     Tenderness: There is no abdominal tenderness. There is no guarding.  Musculoskeletal:        General: Normal range of motion.     Cervical back: Normal range of motion and neck supple.  Skin:    General: Skin is warm and dry.     Coloration: Skin is not pale.     Findings: No erythema or rash.  Neurological:     Mental Status: She is alert and oriented to person, place, and time.     Cranial Nerves: No cranial nerve deficit.     Coordination: Coordination normal.     Deep Tendon Reflexes: Reflexes are normal and symmetric.  Psychiatric:         Judgment: Judgment normal.  CMP     Component Value Date/Time   NA 139 12/22/2023 1112   K 4.4 12/22/2023 1112   CL 102 12/22/2023 1112   CO2 21 12/22/2023 1112   GLUCOSE 173 (H) 12/22/2023 1112   GLUCOSE 114 (H) 06/05/2017 1441   BUN 15 12/22/2023 1112   CREATININE 0.77 12/22/2023 1112   CALCIUM  9.5 12/22/2023 1112   PROT 6.5 12/22/2023 1112   ALBUMIN 4.3 12/22/2023 1112   AST 15 12/22/2023 1112   ALT 10 12/22/2023 1112   ALKPHOS 112 12/22/2023 1112   BILITOT 0.3 12/22/2023 1112   GFR 97.16 06/05/2017 1441   EGFR 83 12/22/2023 1112     Diabetic Labs (most recent): Lab Results  Component Value Date   HGBA1C >15.5 (H) 12/22/2023   HGBA1C 8.2 04/08/2017   HGBA1C 10.7 12/16/2016     Lipid Panel ( most recent) Lipid Panel     Component Value Date/Time   CHOL 216 (H) 12/22/2023 1112   TRIG 97 12/22/2023 1112   HDL 58 12/22/2023 1112   CHOLHDL 3.7 12/22/2023 1112   CHOLHDL 4 06/05/2017 1441   VLDL 32.8 06/05/2017 1441   LDLCALC 141 (H) 12/22/2023 1112   LABVLDL 17 12/22/2023 1112      Lab Results  Component Value Date   TSH 1.360 12/22/2023   FREET4 1.12 12/22/2023      Assessment & Plan:    1. Type 2 diabetes mellitus with hyperglycemia, with long-term current use of insulin  (HCC) (Primary)   - Sherry Aguilar has currently uncontrolled symptomatic type 2 DM since  68 years of age,  with most recent A1c of >15.5 %. Recent labs reviewed. - I had a long discussion with her about the possible risk factors and  the pathology behind diabetes and its complications. -her diabetes is complicated by retinopathy, comorbid hypertension / hyperlipidemia and she remains at a high risk for more acute and chronic complications which include CAD, CVA, CKD, retinopathy, and neuropathy. These are all discussed in detail with her.  - I discussed all available options of managing her diabetes including de-escalation of medications. I have counseled her on Food as Medicine  by adopting a Whole Food , Plant Predominant  ( WFPP) nutrition as recommended by [employer] of Lifestyle Medicine. Patient is encouraged to switch to  unprocessed or minimally processed  complex starch, adequate protein intake (mainly plant source), minimal liquid fat, plenty of fruits, and vegetables. -  she is advised to stick to a routine mealtimes to eat 3 complete meals a day and snack only when necessary (to snack only to correct hypoglycemia BG <70 day time or <100 at night).   - she acknowledges that there is a room for improvement in her food and drink choices. - Further Specific Suggestion is made for her to avoid simple carbohydrates  from her diet including Cakes, Sweet Desserts, Ice Cream, Soda (diet and regular), Sweet Tea, Candies, Chips, Cookies, Store Bought Juices, Alcohol ,  Artificial Sweeteners,  Coffee Creamer, and Sugar-free Products. This will help patient to have more stable blood glucose profile and potentially avoid unintended weight gain.    - she will be scheduled with Penny Crumpton, RDN, CDE for individualized diabetes education.  - I have approached her with the following individualized plan to manage  her diabetes and patient agrees:   - she has not been utilizing insulin  pump properly and/or does not have properly functioning insulin  pump given her recent A1c of  > 15.5%. -  She is advised to suspend insulin  pump for now and she agrees. -I discussed and initiated basal insulin  with Tresiba  50 units nightly,  associated with strict monitoring of glucose 4 times a day-before meals and at bedtime.  - she is encouraged to call clinic for blood glucose levels less than 70 or above 200 mg /dl. -If she calls for hyperglycemia, she will be considered for prandial insulin . - she is advised to continue metformin  500 mg p.o. twice daily, therapeutically suitable for patient . -There is significant cost concern for medications in this patient. She did not afford  Ozempic. She did not afford the CGM. I discussed and added glipizide  5 mg XL p.o. daily at breakfast.  - she will be considered for incretin therapy when and if accessible for her.   - Specific targets for  A1c;  LDL, HDL,  and Triglycerides were discussed with the patient.  2) Blood Pressure /Hypertension:  her blood pressure is  controlled to target.   she is advised to continue her current medications including olmesartan -amlodipine  -HCTZ 40-10-12.5 mg mg p.o. daily with breakfast . 3) Lipids/Hyperlipidemia:   Review of her recent lipid panel showed uncontrolled  LDL at 142 .  I discussed and increased her Crestor  to 20 mg p.o. nightly.  Side effects and precaution discussed with her.    4)  Weight/Diet:  Body mass index is 28.71 kg/m.  -   she is  a candidate for weight loss. I discussed with her the fact that loss of 5 - 10% of her  current body weight will have the most impact on her diabetes management.  The above detailed  ACLM recommendations for nutrition, exercise, sleep, social life, avoidance of risky substances, the need for restorative sleep  information is also detailed on discharge instructions.  5) Chronic Care/Health Maintenance:  -she  is on ACEI/ARB and Statin medications and  is encouraged to initiate and continue to follow up with Ophthalmology, Dentist,  Podiatrist at least yearly or according to recommendations, and advised to  quit smoking. I have recommended yearly flu vaccine and pneumonia vaccine at least every 5 years; moderate intensity exercise for up to 150 minutes weekly; and  sleep for 7- 9 hours a day.  - she is  advised to maintain close follow up with Zarwolo, Gloria, FNP for primary care needs, as well as her other providers for optimal and coordinated care.   Thank you for involving me in the care of this pleasant patient.  I spent  61  minutes in the care of the patient today including review of labs from CMP, Lipids, Thyroid Function, Hematology  (current and previous including abstractions from other facilities); face-to-face time discussing  her blood glucose readings/logs, discussing hypoglycemia and hyperglycemia episodes and symptoms, medications doses, her options of short and long term treatment based on the latest standards of care / guidelines;  discussion about incorporating lifestyle medicine;  and documenting the encounter. Risk reduction counseling performed per USPSTF guidelines to reduce cardiovascular risk factors.      Please refer to Patient Instructions for Blood Glucose Monitoring and Insulin /Medications Dosing Guide  in media tab for additional information. Please  also refer to  Patient Self Inventory in the Media  tab for reviewed elements of pertinent patient history.  Sherry Aguilar participated in the discussions, expressed understanding, and voiced agreement with the above plans.  All questions were answered to her satisfaction. she is encouraged to contact clinic should she have any questions  or concerns prior to her return visit.   Follow up plan: - Return in about 2 weeks (around 02/11/2024) for F/U with Meter/CGM /Logs Only - no Labs.  Ranny Earl, MD [facility] [address] [place], [place] [address] Phone: [phone]  Fax: [phone]    01/28/2024, 3:10 PM  This note was partially dictated with voice recognition software. Similar sounding words can be transcribed inadequately or may not  be corrected upon review.

## 2024-02-16 ENCOUNTER — Ambulatory Visit: Admitting: Family Medicine

## 2024-02-18 ENCOUNTER — Ambulatory Visit: Admitting: "Endocrinology

## 2024-02-18 ENCOUNTER — Encounter: Attending: "Endocrinology | Admitting: Nutrition

## 2024-02-18 ENCOUNTER — Encounter: Payer: Self-pay | Admitting: "Endocrinology

## 2024-02-18 VITALS — BP 114/72 | HR 72 | Ht 66.5 in | Wt 189.0 lb

## 2024-02-18 VITALS — Ht 66.5 in | Wt 189.0 lb

## 2024-02-18 DIAGNOSIS — E1165 Type 2 diabetes mellitus with hyperglycemia: Secondary | ICD-10-CM

## 2024-02-18 DIAGNOSIS — Z794 Long term (current) use of insulin: Secondary | ICD-10-CM | POA: Insufficient documentation

## 2024-02-18 DIAGNOSIS — E7849 Other hyperlipidemia: Secondary | ICD-10-CM

## 2024-02-18 DIAGNOSIS — E559 Vitamin D deficiency, unspecified: Secondary | ICD-10-CM | POA: Diagnosis not present

## 2024-02-18 DIAGNOSIS — I10 Essential (primary) hypertension: Secondary | ICD-10-CM

## 2024-02-18 MED ORDER — CHOLECALCIFEROL 50 MCG (2000 UT) PO CAPS: 1.0000 | ORAL_CAPSULE | Freq: Every day | ORAL | 3 refills | Status: AC

## 2024-02-18 MED ORDER — FREESTYLE LIBRE 3 SENSOR MISC: 2 refills | Status: AC

## 2024-02-18 MED ORDER — TRESIBA FLEXTOUCH 200 UNIT/ML ~~LOC~~ SOPN: 40.0000 [IU] | PEN_INJECTOR | Freq: Every day | SUBCUTANEOUS | 1 refills | Status: AC

## 2024-02-18 NOTE — Patient Instructions (Signed)

## 2024-02-18 NOTE — Progress Notes (Signed)
 02/18/2024, 12:33 PM  Endocrinology follow-up note   Subjective:    Patient ID: Sherry Aguilar, female    DOB: 1955-05-24.  Sherry Aguilar is being seen in consultation for management of currently uncontrolled symptomatic diabetes requested by  Edman Meade PEDLAR, FNP.   Past Medical History:  Diagnosis Date   Cystitis 2017   Diabetes mellitus without complication (HCC)    Hypertension     Past Surgical History:  Procedure Laterality Date   ABDOMINAL HYSTERECTOMY      Social History   Socioeconomic History   Marital status: Widowed    Spouse name: Not on file   Number of children: Not on file   Years of education: Not on file   Highest education level: Not on file  Occupational History   Not on file  Tobacco Use   Smoking status: Former   Smokeless tobacco: Never  Substance and Sexual Activity   Alcohol use: Yes    Comment: occasional   Drug use: Yes    Types: Marijuana   Sexual activity: Not on file  Other Topics Concern   Not on file  Social History Narrative   Not on file   Social Drivers of Health   Financial Resource Strain: Low Risk  (12/22/2023)   Overall Financial Resource Strain (CARDIA)    Difficulty of Paying Living Expenses: Not hard at all  Food Insecurity: No Food Insecurity (12/22/2023)   Hunger Vital Sign    Worried About Running Out of Food in the Last Year: Never true    Ran Out of Food in the Last Year: Never true  Transportation Needs: No Transportation Needs (12/22/2023)   PRAPARE - Administrator, Civil Service (Medical): No    Lack of Transportation (Non-Medical): No  Physical Activity: Inactive (12/22/2023)   Exercise Vital Sign    Days of Exercise per Week: 0 days    Minutes of Exercise per Session: 0 min  Stress: No Stress Concern Present (12/22/2023)   [employer] of Occupational Health - Occupational Stress Questionnaire    Feeling of  Stress: Only a little  Social Connections: Moderately Integrated (12/22/2023)   Social Connection and Isolation Panel    Frequency of Communication with Friends and Family: More than three times a week    Frequency of Social Gatherings with Friends and Family: More than three times a week    Attends Religious Services: More than 4 times per year    Active Member of [employer] or Organizations: Yes    Attends Banker Meetings: More than 4 times per year    Marital Status: Widowed    Family History  Problem Relation Age of Onset   Diabetes Mother    Heart failure Mother    Diabetes Father    Stroke Father    Diabetes Sister    Diabetes Brother    Thyroid disease Neg Hx     Outpatient Encounter Medications as of 02/18/2024  Medication Sig   Cholecalciferol 50 MCG (2000 UT) CAPS Take 1 capsule (2,000 Units total) by mouth daily with breakfast.  Blood Glucose Monitoring Suppl (ACCU-CHEK GUIDE ME) w/Device KIT 1 Piece by Does not apply route as directed.   Blood Glucose Monitoring Suppl DEVI 1 each by Does not apply route in the morning, at noon, and at bedtime. May substitute to any manufacturer covered by patient's insurance.   Continuous Glucose Sensor (FREESTYLE LIBRE 3 SENSOR) MISC Place 1 sensor on the skin every 14 days. Use to check glucose continuously   Doxazosin Mesylate (CARDURA PO) doxazosin takes 1 mg at bedtime (Patient not taking: Reported on 01/28/2024)   glipiZIDE  (GLUCOTROL  XL) 5 MG 24 hr tablet Take 1 tablet (5 mg total) by mouth daily with breakfast.   glucose blood (ACCU-CHEK GUIDE TEST) test strip Use to monitor glucose 4 times daily  as instructed   HUMALOG 100 UNIT/ML injection Inject 1 Units into the skin daily. Hold until next visit (Patient not taking: Reported on 02/18/2024)   insulin  degludec (TRESIBA  FLEXTOUCH) 200 UNIT/ML FlexTouch Pen Inject 40 Units into the skin at bedtime.   Insulin  Pen Needle 29G X MISC Use to inject insulin  daily   MAGNESIUM  CITRATE PO Take 1 tablet by mouth daily.   metFORMIN  (GLUCOPHAGE -XR) 500 MG 24 hr tablet Take 1 tablet (500 mg total) by mouth 2 (two) times daily with a meal.   Olmesartan -amLODIPine -HCTZ 40-10-12.5 MG TABS Take 1 tablet by mouth daily.   rosuvastatin  (CRESTOR ) 20 MG tablet Take 1 tablet (20 mg total) by mouth at bedtime.   Vitamin D , Ergocalciferol , (DRISDOL ) 1.25 MG (50000 UNIT) CAPS capsule Take 1 capsule (50,000 Units total) by mouth every 7 (seven) days.   ZORVOLEX 35 MG CAPS Take 1 tablet by mouth 3 (three) times daily.  (Patient not taking: Reported on 12/22/2023)   [DISCONTINUED] Continuous Glucose Sensor (FREESTYLE LIBRE 3 SENSOR) MISC Place 1 sensor on the skin every 14 days. Use to check glucose continuously   [DISCONTINUED] insulin  degludec (TRESIBA  FLEXTOUCH) 200 UNIT/ML FlexTouch Pen Inject 50 Units into the skin at bedtime.   No facility-administered encounter medications on file as of 02/18/2024.    ALLERGIES: Allergies  Allergen Reactions   Pecan Nut (Diagnostic) Anaphylaxis    Tree nut allergy   Nitroglycerin Nausea And Vomiting   Other     Raw fruit    VACCINATION STATUS:  There is no immunization history on file for this patient.  Diabetes She presents for her follow-up diabetic visit. She has type 2 diabetes mellitus. Onset time: She was diagnosed at approximate age of 48 years. Her disease course has been worsening. There are no hypoglycemic associated symptoms. Pertinent negatives for hypoglycemia include no confusion, headaches, pallor or seizures. Associated symptoms include fatigue, polydipsia and polyuria. Pertinent negatives for diabetes include no chest pain and no polyphagia. There are no hypoglycemic complications. Symptoms are worsening. Diabetic complications include retinopathy. (Persistent hyperglycemia with recent A1c of 15.5%, comorbid hypertension and hyperlipidemia.) Risk factors for coronary artery disease include dyslipidemia, post-menopausal, tobacco  exposure, sedentary lifestyle, hypertension and diabetes mellitus. Current diabetic treatment includes insulin  injections (She has a faulty insulin  pump which was supposed to deliver 50 units of insulin  daily, however admittedly patient did have problems with not delivering insulin  properly and leaking.  She did not bring it with her.). Her weight is fluctuating minimally. She is following a generally unhealthy diet. When asked about meal planning, she reported none. She rarely participates in exercise. Her home blood glucose trend is increasing steadily. Her overall blood glucose range is >200 mg/dl. (On December 22, 2023 her  A1c was >15.5%.  She was given strict instruction to monitor blood glucose and documented insulin  injection activities.  Tragically, patient returns without any meter nor any insulin  injection activities.  She reports that she has had hypoglycemia, in the 50s , 3 occasions since last visit.   ) An ACE inhibitor/angiotensin II receptor blocker is being taken. Eye exam is current.  Hyperlipidemia This is a chronic problem. The problem is uncontrolled. Exacerbating diseases include diabetes. Pertinent negatives include no chest pain, myalgias or shortness of breath. Current antihyperlipidemic treatment includes statins. Risk factors for coronary artery disease include diabetes mellitus, dyslipidemia, hypertension, a sedentary lifestyle and post-menopausal.  Hypertension This is a chronic problem. The current episode started more than 1 year ago. The problem is controlled. Pertinent negatives include no chest pain, headaches, palpitations or shortness of breath. Risk factors for coronary artery disease include dyslipidemia, diabetes mellitus, post-menopausal state, sedentary lifestyle and smoking/tobacco exposure. Past treatments include angiotensin blockers. Hypertensive end-organ damage includes retinopathy.       Objective:       02/18/2024   10:43 AM 02/18/2024   10:16 AM 01/28/2024     2:20 PM  Vitals with BMI  Height 5' 6.5 5' 6.5 5' 6.5  Weight 189 lbs 189 lbs 180 lbs 10 oz  BMI 30.05 30.05 28.72  Systolic  114 112  Diastolic  72 64  Pulse  72 80    BP 114/72   Pulse 72   Ht 5' 6.5 (1.689 m)   Wt 189 lb (85.7 kg)   LMP 03/31/2011   BMI 30.05 kg/m   Wt Readings from Last 3 Encounters:  02/18/24 189 lb (85.7 kg)  02/18/24 189 lb (85.7 kg)  01/28/24 180 lb 9.6 oz (81.9 kg)        CMP     Component Value Date/Time   NA 139 12/22/2023 1112   K 4.4 12/22/2023 1112   CL 102 12/22/2023 1112   CO2 21 12/22/2023 1112   GLUCOSE 173 (H) 12/22/2023 1112   GLUCOSE 114 (H) 06/05/2017 1441   BUN 15 12/22/2023 1112   CREATININE 0.77 12/22/2023 1112   CALCIUM  9.5 12/22/2023 1112   PROT 6.5 12/22/2023 1112   ALBUMIN 4.3 12/22/2023 1112   AST 15 12/22/2023 1112   ALT 10 12/22/2023 1112   ALKPHOS 112 12/22/2023 1112   BILITOT 0.3 12/22/2023 1112   GFR 97.16 06/05/2017 1441   EGFR 83 12/22/2023 1112     Diabetic Labs (most recent): Lab Results  Component Value Date   HGBA1C >15.5 (H) 12/22/2023   HGBA1C 8.2 04/08/2017   HGBA1C 10.7 12/16/2016     Lipid Panel ( most recent) Lipid Panel     Component Value Date/Time   CHOL 216 (H) 12/22/2023 1112   TRIG 97 12/22/2023 1112   HDL 58 12/22/2023 1112   CHOLHDL 3.7 12/22/2023 1112   CHOLHDL 4 06/05/2017 1441   VLDL 32.8 06/05/2017 1441   LDLCALC 141 (H) 12/22/2023 1112   LABVLDL 17 12/22/2023 1112      Lab Results  Component Value Date   TSH 1.360 12/22/2023   FREET4 1.12 12/22/2023      Assessment & Plan:    1. Type 2 diabetes mellitus with hyperglycemia, with long-term current use of insulin  (HCC) (Primary)   - Sherry Aguilar has currently uncontrolled symptomatic type 2 DM since  68 years of age. - Her recent A1c was>15.5 %. Recent labs reviewed. On December 22, 2023 her A1c was >  15.5%.  She was given strict instruction to monitor blood glucose and documented insulin  injection  activities.  Tragically, patient returns without any meter nor any insulin  injection activities.  She reports that she has had hypoglycemia, in the 50s , 3 occasions since last visit.   -Her non-commitment for proper monitoring and safe use of insulin  makes it difficult to optimize her treatment. - I had a long discussion with her about the possible risk factors and  the pathology behind diabetes and its complications. -her diabetes is complicated by retinopathy, comorbid hypertension / hyperlipidemia and she remains at a high risk for more acute and chronic complications which include CAD, CVA, CKD, retinopathy, and neuropathy. These are all discussed in detail with her.  - I discussed all available options of managing her diabetes including de-escalation of medications. I have counseled her on Food as Medicine by adopting a Whole Food , Plant Predominant  ( WFPP) nutrition as recommended by [employer] of Lifestyle Medicine. Patient is encouraged to switch to  unprocessed or minimally processed  complex starch, adequate protein intake (mainly plant source), minimal liquid fat, plenty of fruits, and vegetables. -  she is advised to stick to a routine mealtimes to eat 3 complete meals a day and snack only when necessary (to snack only to correct hypoglycemia BG <70 day time or <100 at night).     - she admits to sugar addiction, acknowledges that there is a room for improvement in her food and drink choices. - Further Specific Suggestion is made for her to avoid simple carbohydrates  from her diet including Cakes, Sweet Desserts, Ice Cream, Soda (diet and regular), Sweet Tea, Candies, Chips, Cookies, Store Bought Juices, Alcohol ,  Artificial Sweeteners,  Coffee Creamer, and Sugar-free Products. This will help patient to have more stable blood glucose profile and potentially avoid unintended weight gain. - she will be scheduled with Penny Crumpton, RDN, CDE for individualized diabetes  education.  - I have approached her with the following individualized plan to manage  her diabetes and patient agrees:   -Based on her report of random hypoglycemia, she is advised to lower Tresiba  to 40 units nightly.  Asked to start monitoring blood glucose 4 times a day, or use a CGM to monitor blood glucose continuously. - she is encouraged to call clinic for blood glucose levels less than 70 or above 200 mg /dl. -If she calls for hyperglycemia, she will be considered for prandial insulin . - she is advised to continue metformin  500 mg p.o. twice daily, therapeutically suitable for patient . -She is advised to continue glipizide  5 mg XL p.o. daily at breakfast. -There is significant cost concern for medications and monitoring devices in this patient. She did not afford Ozempic. She did not afford the CGM. - she will be considered for incretin therapy when and if accessible for her.   - Specific targets for  A1c;  LDL, HDL,  and Triglycerides were discussed with the patient.  2) Blood Pressure /Hypertension:   Her blood pressure is controlled to target.  she is advised to continue her current medications including olmesartan -amlodipine  -HCTZ 40-10-12.5 mg mg p.o. daily with breakfast . 3) Lipids/Hyperlipidemia:   Review of her recent lipid panel showed uncontrolled  LDL at 141 .  She is advised to continue Crestor  20 mg p.o. nightly. Side effects and precaution discussed with her.    4)  Weight/Diet:  Body mass index is 30.05 kg/m.  -   she is  a candidate for weight loss. I discussed with her the fact that loss of 5 - 10% of her  current body weight will have the most impact on her diabetes management.  The above detailed  ACLM recommendations for nutrition, exercise, sleep, social life, avoidance of risky substances, the need for restorative sleep  information is also detailed on discharge instructions.  5) Chronic Care/Health Maintenance:  -she  is on ACEI/ARB and Statin medications  and  is encouraged to initiate and continue to follow up with Ophthalmology, Dentist,  Podiatrist at least yearly or according to recommendations, and advised to  quit smoking. I have recommended yearly flu vaccine and pneumonia vaccine at least every 5 years; moderate intensity exercise for up to 150 minutes weekly; and  sleep for 7- 9 hours a day.  - she is  advised to maintain close follow up with [place], Meade PEDLAR, FNP for primary care needs, as well as her other providers for optimal and coordinated care.   I spent  26  minutes in the care of the patient today including review of labs from CMP, Lipids, Thyroid Function, Hematology (current and previous including abstractions from other facilities); face-to-face time discussing  her blood glucose readings/logs, discussing hypoglycemia and hyperglycemia episodes and symptoms, medications doses, her options of short and long term treatment based on the latest standards of care / guidelines;  discussion about incorporating lifestyle medicine;  and documenting the encounter. Risk reduction counseling performed per USPSTF guidelines to reduce  obesity and cardiovascular risk factors.     Please refer to Patient Instructions for Blood Glucose Monitoring and Insulin /Medications Dosing Guide  in media tab for additional information. Please  also refer to  Patient Self Inventory in the Media  tab for reviewed elements of pertinent patient history.  Sherry Aguilar participated in the discussions, expressed understanding, and voiced agreement with the above plans.  All questions were answered to her satisfaction. she is encouraged to contact clinic should she have any questions or concerns prior to her return visit.    Follow up plan: - Return in about 2 weeks (around 03/03/2024) for F/U with Meter/CGM Jonnie Only - no Labs.  Ranny Earl, MD [facility] [address] [place], [place] [address] Phone:  [phone]  Fax: [phone]    02/18/2024, 12:33 PM  This note was partially dictated with voice recognition software. Similar sounding words can be transcribed inadequately or may not  be corrected upon review.

## 2024-02-18 NOTE — Patient Instructions (Signed)
 Goals  Eat three meals per day Eat 30-25 grams of carbs per meal Drink 4-5 bottles of water per day Check blood sugars Take Tresiba  40 units one a day Take Metformin  500 mg BID after meals

## 2024-02-18 NOTE — Progress Notes (Signed)
 Medical Nutrition Therapy  Appointment Start time:  1045  Appointment End time:  1200  Primary concerns today: DM Type 2  Referral diagnosis: E11.8 Preferred learning style: Hear and read  Learning readiness: Ready   NUTRITION ASSESSMENT  68 yr old bfemale referred for Uncontrolled Type 2 DM. Sees Dr. Lenis, Endocrinology. PCP Meade Gerlach Metformin  500 mg BID, Tresiba  40 units a day. FBS 70's -140's  Lunchtime  glucose is in the 200's  range, forgets to check dinner; doesn't check  blood sugars at bedtime. Drinks SF juices often. Has been ordered a CGM but hasn't gotten it yet. Current diet is inconsistent to meet her needs and control her blood sugars.  She is willing to work on eating better balanced meals and cut out sugar free products.  Clinical Medical Hx:  Past Medical History:  Diagnosis Date   Cystitis 2017   Diabetes mellitus without complication (HCC)    Hypertension     Medications:  Current Outpatient Medications on File Prior to Visit  Medication Sig Dispense Refill   Blood Glucose Monitoring Suppl (ACCU-CHEK GUIDE ME) w/Device KIT 1 Piece by Does not apply route as directed. 1 kit 0   Blood Glucose Monitoring Suppl DEVI 1 each by Does not apply route in the morning, at noon, and at bedtime. May substitute to any manufacturer covered by patient's insurance. 1 each 0   Cholecalciferol 50 MCG (2000 UT) CAPS Take 1 capsule (2,000 Units total) by mouth daily with breakfast. 90 capsule 3   Continuous Glucose Sensor (FREESTYLE LIBRE 3 SENSOR) MISC Place 1 sensor on the skin every 14 days. Use to check glucose continuously 2 each 2   Doxazosin Mesylate (CARDURA PO) doxazosin takes 1 mg at bedtime (Patient not taking: Reported on 01/28/2024)     glipiZIDE  (GLUCOTROL  XL) 5 MG 24 hr tablet Take 1 tablet (5 mg total) by mouth daily with breakfast. 90 tablet 1   glucose blood (ACCU-CHEK GUIDE TEST) test strip Use to monitor glucose 4 times daily  as instructed 150 each 2    HUMALOG 100 UNIT/ML injection Inject 1 Units into the skin daily. Hold until next visit (Patient not taking: Reported on 02/18/2024)     insulin  degludec (TRESIBA  FLEXTOUCH) 200 UNIT/ML FlexTouch Pen Inject 40 Units into the skin at bedtime. 9 mL 1   Insulin  Pen Needle 29G X MISC Use to inject insulin  daily 100 each 1   MAGNESIUM CITRATE PO Take 1 tablet by mouth daily.     metFORMIN  (GLUCOPHAGE -XR) 500 MG 24 hr tablet Take 1 tablet (500 mg total) by mouth 2 (two) times daily with a meal. 120 tablet 0   Olmesartan -amLODIPine -HCTZ 40-10-12.5 MG TABS Take 1 tablet by mouth daily. 30 tablet 1   rosuvastatin  (CRESTOR ) 20 MG tablet Take 1 tablet (20 mg total) by mouth at bedtime. 90 tablet 1   Vitamin D , Ergocalciferol , (DRISDOL ) 1.25 MG (50000 UNIT) CAPS capsule Take 1 capsule (50,000 Units total) by mouth every 7 (seven) days. 27 capsule 1   ZORVOLEX 35 MG CAPS Take 1 tablet by mouth 3 (three) times daily.  (Patient not taking: Reported on 12/22/2023)     No current facility-administered medications on file prior to visit.    Labs:  Lab Results  Component Value Date   HGBA1C >15.5 (H) 12/22/2023      Latest Ref Rng & Units 12/22/2023   11:12 AM 06/05/2017    2:41 PM 12/16/2016   12:00 AM  CMP  Glucose  70 - 99 mg/dL 826  885    BUN 8 - 27 mg/dL 15  11    Creatinine 9.42 - 1.00 mg/dL 9.22  9.22  1.0      Sodium 134 - 144 mmol/L 139  137    Potassium 3.5 - 5.2 mmol/L 4.4  4.0    Chloride 96 - 106 mmol/L 102  103    CO2 20 - 29 mmol/L 21  27    Calcium  8.7 - 10.3 mg/dL 9.5  9.7    Total Protein 6.0 - 8.5 g/dL 6.5     Total Bilirubin 0.0 - 1.2 mg/dL 0.3     Alkaline Phos 44 - 121 IU/L 112     AST 0 - 40 IU/L 15     ALT 0 - 32 IU/L 10        This result is from an external source.    Notable Signs/Symptoms: None  Lifestyle & Dietary Hx Lives with great great daughter.   Estimated daily fluid intake: 30 oz Supplements:  Sleep:  Stress / self-care:  Current average weekly  physical activity: ADL  24-Hr Dietary Recall First Meal:  8 am 3 Bacon and  2 eggs, coffee black Snack: water or sf cranberry juice Second Meal: Lettuce wrap- ham, tomatoe, lettuce,  water Snack: peach and crackers Third Meal:   7pm 3 chicken wings, water Snack:  Beverages: water, sf juices.  Estimated Energy Needs Calories: 1200 Carbohydrate: 135g Protein: 90g Fat: 33g   NUTRITION DIAGNOSIS  NB-1.1 Food and nutrition-related knowledge deficit As related to inconsisent CHO intake.  As evidenced by A1C >15%.SABRA   NUTRITION INTERVENTION  Nutrition education (E-1) on the following topics:  Nutrition and Diabetes education provided on My Plate, CHO counting, meal planning, portion sizes, timing of meals, avoiding snacks between meals unless having a low blood sugar, target ranges for A1C and blood sugars, signs/symptoms and treatment of hyper/hypoglycemia, monitoring blood sugars, taking medications as prescribed, benefits of exercising 30 minutes per day and prevention of complications of DM.  Lifestyle Medicine  - Whole Food, Plant Predominant Nutrition is highly recommended: Eat Plenty of vegetables, Mushrooms, fruits, Legumes, Whole Grains, Nuts, seeds in lieu of processed meats, processed snacks/pastries red meat, poultry, eggs.    -It is better to avoid simple carbohydrates including: Cakes, Sweet Desserts, Ice Cream, Soda (diet and regular), Sweet Tea, Candies, Chips, Cookies, Store Bought Juices, Alcohol in Excess of  1-2 drinks a day, Lemonade,  Artificial Sweeteners, Doughnuts, Coffee Creamers, Sugar-free Products, etc, etc.  This is not a complete list.....  Exercise: If you are able: 30 -60 minutes a day ,4 days a week, or 150 minutes a week.  The longer the better.  Combine stretch, strength, and aerobic activities.  If you were told in the past that you have high risk for cardiovascular diseases, you may seek evaluation by your heart doctor prior to initiating moderate to  intense exercise programs.   Handouts Provided Include  Lifestyle Medicine   Learning Style & Readiness for Change Teaching method utilized: Visual & Auditory  Demonstrated degree of understanding via: Teach Back  Barriers to learning/adherence to lifestyle change: none  Goals Established by Pt Goals  Eat three meals per day Eat 30-25 grams of carbs per meal Drink 4-5 bottles of water per day Check blood sugars Take Tresiba  40 units one a day Take Metformin  500 mg BID after meals   MONITORING & EVALUATION Dietary intake, weekly physical activity, and blood sugars  in 1 month.  Next Steps  Patient is to work on better balanced meals.SABRA

## 2024-03-10 ENCOUNTER — Ambulatory Visit: Admitting: "Endocrinology

## 2024-03-19 ENCOUNTER — Encounter: Payer: Self-pay | Admitting: *Deleted

## 2024-03-19 NOTE — Progress Notes (Signed)
 Sherry Aguilar                                          MRN: [id number]   03/19/2024   The [facility] Quality Team Specialist reviewed this patient medical record for the purposes of chart review for care gap closure. The following were reviewed: abstraction for care gap closure-controlling blood pressure.    [facility] Quality Team

## 2024-03-19 NOTE — Progress Notes (Signed)
 Sherry Aguilar                                          MRN: [id number]   03/19/2024   The [facility] Quality Team Specialist reviewed this patient medical record for the purposes of chart review for care gap closure. The following were reviewed: chart review for care gap closure-glycemic status assessment and kidney health evaluation for diabetes:eGFR  and uACR.    [facility]I Quality Team

## 2024-03-24 ENCOUNTER — Encounter: Attending: "Endocrinology | Admitting: Nutrition

## 2024-03-24 DIAGNOSIS — E1165 Type 2 diabetes mellitus with hyperglycemia: Secondary | ICD-10-CM | POA: Insufficient documentation

## 2024-03-24 DIAGNOSIS — Z794 Long term (current) use of insulin: Secondary | ICD-10-CM | POA: Insufficient documentation

## 2024-03-25 ENCOUNTER — Other Ambulatory Visit: Payer: Self-pay | Admitting: "Endocrinology

## 2024-03-25 ENCOUNTER — Ambulatory Visit

## 2024-03-25 ENCOUNTER — Other Ambulatory Visit (HOSPITAL_BASED_OUTPATIENT_CLINIC_OR_DEPARTMENT_OTHER): Payer: Self-pay

## 2024-03-25 ENCOUNTER — Other Ambulatory Visit: Payer: Self-pay

## 2024-03-25 VITALS — Ht 66.5 in | Wt 189.0 lb

## 2024-03-25 DIAGNOSIS — Z780 Asymptomatic menopausal state: Secondary | ICD-10-CM

## 2024-03-25 DIAGNOSIS — I10 Essential (primary) hypertension: Secondary | ICD-10-CM

## 2024-03-25 DIAGNOSIS — E1165 Type 2 diabetes mellitus with hyperglycemia: Secondary | ICD-10-CM

## 2024-03-25 DIAGNOSIS — Z0000 Encounter for general adult medical examination without abnormal findings: Secondary | ICD-10-CM

## 2024-03-25 DIAGNOSIS — Z1231 Encounter for screening mammogram for malignant neoplasm of breast: Secondary | ICD-10-CM

## 2024-03-25 DIAGNOSIS — Z1211 Encounter for screening for malignant neoplasm of colon: Secondary | ICD-10-CM

## 2024-03-25 MED ORDER — VITAMIN D (ERGOCALCIFEROL) 1.25 MG (50000 UNIT) PO CAPS
50000.0000 [IU] | ORAL_CAPSULE | ORAL | 1 refills | Status: AC
  Filled 2024-05-18: qty 27, 189d supply, fill #0
  Filled 2024-05-18 (×2): qty 12, 84d supply, fill #0

## 2024-03-25 MED ORDER — OLMESARTAN-AMLODIPINE-HCTZ 40-10-12.5 MG PO TABS
1.0000 | ORAL_TABLET | Freq: Every day | ORAL | 3 refills | Status: AC
  Filled 2024-03-25: qty 30, 30d supply, fill #0
  Filled 2024-05-17 – 2024-05-18 (×3): qty 30, 30d supply, fill #1
  Filled 2024-06-10: qty 30, 30d supply, fill #2

## 2024-03-25 MED ORDER — FREESTYLE LIBRE 3 PLUS SENSOR MISC
1.0000 | 2 refills | Status: AC
  Filled 2024-05-17: qty 2, 28d supply, fill #0
  Filled 2024-05-18: qty 2, 30d supply, fill #0

## 2024-03-25 MED ORDER — INSULIN DEGLUDEC 200 UNIT/ML ~~LOC~~ SOPN
50.0000 [IU] | PEN_INJECTOR | Freq: Every day | SUBCUTANEOUS | 1 refills | Status: DC
  Filled 2024-03-25: qty 6, 24d supply, fill #0
  Filled 2024-03-25: qty 9, 36d supply, fill #0

## 2024-03-25 NOTE — Progress Notes (Signed)
 I connected with  Sherry Aguilar on 03/25/24 by a audio enabled telemedicine application and verified that I am speaking with the correct person using two identifiers.  Patient Location: Home  Provider Location: Office/Clinic  I discussed the limitations of evaluation and management by telemedicine. The patient expressed understanding and agreed to proceed.  Subjective:   Sherry Aguilar is a 68 y.o. female who presents for a Medicare Annual Wellness Visit.  Allergies (verified) Pecan nut (diagnostic), Nitroglycerin, and Other   History: Past Medical History:  Diagnosis Date   Cystitis 2017   Diabetes mellitus without complication (HCC)    Hypertension    Past Surgical History:  Procedure Laterality Date   ABDOMINAL HYSTERECTOMY     Family History  Problem Relation Age of Onset   Diabetes Mother    Heart failure Mother    Diabetes Father    Stroke Father    Diabetes Sister    Diabetes Brother    Thyroid disease Neg Hx    Social History   Occupational History   Not on file  Tobacco Use   Smoking status: Former   Smokeless tobacco: Never  Substance and Sexual Activity   Alcohol use: Yes    Comment: occasional   Drug use: Yes    Types: Marijuana   Sexual activity: Not on file   Tobacco Counseling Counseling given: Not Answered  SDOH Screenings   Food Insecurity: No Food Insecurity (12/22/2023)  Housing: Unknown (12/22/2023)  Transportation Needs: No Transportation Needs (12/22/2023)  Utilities: Not At Risk (12/22/2023)  Alcohol Screen: Low Risk  (12/22/2023)  Depression (PHQ2-9): Medium Risk (12/22/2023)  Financial Resource Strain: Low Risk  (12/22/2023)  Physical Activity: Inactive (12/22/2023)  Social Connections: Moderately Integrated (12/22/2023)  Stress: No Stress Concern Present (12/22/2023)  Tobacco Use: Medium Risk (02/18/2024)  Health Literacy: Adequate Health Literacy (12/22/2023)   Depression Screen    12/22/2023   10:26 AM  PHQ 2/9 Scores  PHQ - 2 Score 1  PHQ-  9 Score 6     Goals Addressed   None    Fall Screening Falls in the past year?: 0 Number of falls in past year: 0 Was there an injury with Fall?: 0 Fall Risk Category Calculator: 0 Patient Fall Risk Level: Low Fall Risk  Fall Risk Fall risk Follow up: Falls evaluation completed        Objective:    There were no vitals filed for this visit. There is no height or weight on file to calculate BMI.  Current Medications (verified) Outpatient Encounter Medications as of 03/25/2024  Medication Sig   Blood Glucose Monitoring Suppl (ACCU-CHEK GUIDE ME) w/Device KIT 1 Piece by Does not apply route as directed.   Blood Glucose Monitoring Suppl DEVI 1 each by Does not apply route in the morning, at noon, and at bedtime. May substitute to any manufacturer covered by patient's insurance.   Cholecalciferol 50 MCG (2000 UT) CAPS Take 1 capsule (2,000 Units total) by mouth daily with breakfast.   Continuous Glucose Sensor (FREESTYLE LIBRE 3 SENSOR) MISC Place 1 sensor on the skin every 14 days. Use to check glucose continuously   glipiZIDE  (GLUCOTROL  XL) 5 MG 24 hr tablet Take 1 tablet (5 mg total) by mouth daily with breakfast.   glucose blood (ACCU-CHEK GUIDE TEST) test strip Use to monitor glucose 4 times daily  as instructed   insulin  degludec (TRESIBA  FLEXTOUCH) 200 UNIT/ML FlexTouch Pen Inject 40 Units into the skin at bedtime.   Insulin   Pen Needle 29G X MISC Use to inject insulin  daily   MAGNESIUM CITRATE PO Take 1 tablet by mouth daily.   metFORMIN  (GLUCOPHAGE -XR) 500 MG 24 hr tablet Take 1 tablet (500 mg total) by mouth 2 (two) times daily with a meal.   Olmesartan -amLODIPine -HCTZ 40-10-12.5 MG TABS Take 1 tablet by mouth daily.   rosuvastatin  (CRESTOR ) 20 MG tablet Take 1 tablet (20 mg total) by mouth at bedtime.   ZORVOLEX 35 MG CAPS Take 1 tablet by mouth 3 (three) times daily.    Doxazosin Mesylate (CARDURA PO) doxazosin takes 1 mg at bedtime (Patient not taking: Reported on  03/25/2024)   Vitamin D , Ergocalciferol , (DRISDOL ) 1.25 MG (50000 UNIT) CAPS capsule Take 1 capsule (50,000 Units total) by mouth every 7 (seven) days. (Patient not taking: Reported on 03/25/2024)   [DISCONTINUED] HUMALOG 100 UNIT/ML injection Inject 1 Units into the skin daily. Hold until next visit (Patient not taking: Reported on 02/18/2024)   No facility-administered encounter medications on file as of 03/25/2024.   Hearing/Vision screen Hearing Screening - Comments:: Patient denies any hearing difficulties.   Immunizations and Health Maintenance Health Maintenance  Topic Date Due   Medicare Annual Wellness (AWV)  Never done   Diabetic kidney evaluation - Urine ACR  Never done   DTaP/Tdap/Td (1 - Tdap) Never done   Pneumococcal Vaccine: 50+ Years (1 of 2 - PCV) Never done   Mammogram  Never done   Colonoscopy  Never done   Zoster Vaccines- Shingrix (1 of 2) Never done   FOOT EXAM  02/27/2018   DEXA SCAN  Never done   Influenza Vaccine  Never done   COVID-19 Vaccine (1 - 2025-26 season) Never done   HEMOGLOBIN A1C  06/23/2024   Diabetic kidney evaluation - eGFR measurement  12/21/2024   OPHTHALMOLOGY EXAM  12/30/2024   Hepatitis C Screening  Completed   Meningococcal B Vaccine  Aged Out        Assessment/Plan:  This is a routine wellness examination for Sherry Aguilar.  Patient Care Team: Bacchus, Meade PEDLAR, FNP as PCP - General (Family Medicine)  I have personally reviewed and noted the following in the patient's chart:   Medical and social history Use of alcohol, tobacco or illicit drugs  Current medications and supplements including opioid prescriptions. Functional ability and status Nutritional status Physical activity Advanced directives List of other physicians Hospitalizations, surgeries, and ER visits in previous 12 months Vitals Screenings to include cognitive, depression, and falls Referrals and appointments  No orders of the defined types were placed in this  encounter.  In addition, I have reviewed and discussed with patient certain preventive protocols, quality metrics, and best practice recommendations. A written personalized care plan for preventive services as well as general preventive health recommendations were provided to patient.   Domanic Matusek, CMA   03/25/2024   No follow-ups on file.  After Visit Summary: (MyChart) Due to this being a telephonic visit, the after visit summary with patients personalized plan was offered to patient via MyChart   Nurse Notes: mammogram, dexa ordered. Referrals placed for GI for colonoscopy and Podiatry referral placed for diabetic foot exam.

## 2024-03-25 NOTE — Patient Instructions (Addendum)
 Ms. Finnell,  Thank you for taking the time for your Medicare Wellness Visit. I appreciate your continued commitment to your health goals. Please review the care plan we discussed, and feel free to reach out if I can assist you further.  Please note that Annual Wellness Visits do not include a physical exam. Some assessments may be limited, especially if the visit was conducted virtually. If needed, we may recommend an in-person follow-up with your provider.  Ongoing Care Seeing your primary care provider every 3 to 6 months helps us  monitor your health and provide consistent, personalized care.   Referrals If a referral was made during today's visit and you haven't received any updates within two weeks, please contact the referred provider directly to check on the status.  Colonoscopy Referral: [facility] at  [address] [place] Phone: [phone]  HORATIO CLAUDINA MORITA [address] [place], [place] [address] [phone]  Podiatry Referral Dr. Lamar [facility] [address] [place] [address] Phone [phone]  Recommended Screenings:  Health Maintenance  Topic Date Due   Yearly kidney health urinalysis for diabetes  Never done   DTaP/Tdap/Td vaccine (1 - Tdap) Never done   Pneumococcal Vaccine for age over 64 (1 of 2 - PCV) Never done   Breast Cancer Screening  Never done   Colon Cancer Screening  Never done   Zoster (Shingles) Vaccine (1 of 2) Never done   Complete foot exam   02/27/2018   DEXA scan (bone density measurement)  Never done   Flu Shot  Never done   COVID-19 Vaccine (1 - 2025-26 season) Never done   Hemoglobin A1C  06/23/2024   Yearly kidney function blood test for diabetes  12/21/2024   Eye exam for diabetics  12/30/2024   Medicare Annual Wellness Visit  03/25/2025   Hepatitis C Screening  Completed   Meningitis B Vaccine  Aged Out       03/25/2024     9:48 AM  Advanced Directives  Does Patient Have a Medical Advance Directive? No  Would patient like information on creating a medical advance directive? No - Patient declined    Vision: Annual vision screenings are recommended for early detection of glaucoma, cataracts, and diabetic retinopathy. These exams can also reveal signs of chronic conditions such as diabetes and high blood pressure.  Dental: Annual dental screenings help detect early signs of oral cancer, gum disease, and other conditions linked to overall health, including heart disease and diabetes.  Please see the attached documents for additional preventive care recommendations.

## 2024-03-29 ENCOUNTER — Encounter (INDEPENDENT_AMBULATORY_CARE_PROVIDER_SITE_OTHER): Payer: Self-pay | Admitting: *Deleted

## 2024-04-06 ENCOUNTER — Other Ambulatory Visit (HOSPITAL_BASED_OUTPATIENT_CLINIC_OR_DEPARTMENT_OTHER): Payer: Self-pay

## 2024-04-06 ENCOUNTER — Other Ambulatory Visit: Payer: Self-pay | Admitting: Family Medicine

## 2024-04-06 ENCOUNTER — Other Ambulatory Visit: Payer: Self-pay | Admitting: "Endocrinology

## 2024-04-06 DIAGNOSIS — E1165 Type 2 diabetes mellitus with hyperglycemia: Secondary | ICD-10-CM

## 2024-04-06 MED ORDER — METFORMIN HCL ER 500 MG PO TB24
500.0000 mg | ORAL_TABLET | Freq: Two times a day (BID) | ORAL | 0 refills | Status: DC
  Filled 2024-04-06: qty 120, 60d supply, fill #0

## 2024-04-07 ENCOUNTER — Other Ambulatory Visit (HOSPITAL_BASED_OUTPATIENT_CLINIC_OR_DEPARTMENT_OTHER): Payer: Self-pay

## 2024-04-14 ENCOUNTER — Ambulatory Visit: Admitting: "Endocrinology

## 2024-04-21 DIAGNOSIS — N644 Mastodynia: Secondary | ICD-10-CM | POA: Diagnosis not present

## 2024-04-21 DIAGNOSIS — R928 Other abnormal and inconclusive findings on diagnostic imaging of breast: Secondary | ICD-10-CM | POA: Diagnosis not present

## 2024-04-21 LAB — HM MAMMOGRAPHY

## 2024-04-22 ENCOUNTER — Encounter: Payer: Self-pay | Admitting: Family Medicine

## 2024-05-17 ENCOUNTER — Other Ambulatory Visit: Payer: Self-pay | Admitting: "Endocrinology

## 2024-05-17 ENCOUNTER — Other Ambulatory Visit: Payer: Self-pay

## 2024-05-17 ENCOUNTER — Other Ambulatory Visit (HOSPITAL_COMMUNITY): Payer: Self-pay

## 2024-05-17 MED ORDER — TRESIBA FLEXTOUCH 200 UNIT/ML ~~LOC~~ SOPN
40.0000 [IU] | PEN_INJECTOR | Freq: Every day | SUBCUTANEOUS | 1 refills | Status: AC
  Filled 2024-05-17: qty 6, 30d supply, fill #0
  Filled 2024-06-10: qty 6, 30d supply, fill #1

## 2024-05-18 ENCOUNTER — Other Ambulatory Visit (HOSPITAL_BASED_OUTPATIENT_CLINIC_OR_DEPARTMENT_OTHER): Payer: Self-pay

## 2024-05-18 ENCOUNTER — Other Ambulatory Visit (HOSPITAL_COMMUNITY): Payer: Self-pay

## 2024-05-18 ENCOUNTER — Other Ambulatory Visit: Payer: Self-pay

## 2024-05-19 ENCOUNTER — Other Ambulatory Visit (HOSPITAL_COMMUNITY): Payer: Self-pay

## 2024-05-31 ENCOUNTER — Other Ambulatory Visit (HOSPITAL_COMMUNITY): Payer: Self-pay

## 2024-05-31 ENCOUNTER — Other Ambulatory Visit: Payer: Self-pay | Admitting: "Endocrinology

## 2024-05-31 DIAGNOSIS — E1165 Type 2 diabetes mellitus with hyperglycemia: Secondary | ICD-10-CM

## 2024-05-31 MED ORDER — METFORMIN HCL ER 500 MG PO TB24
500.0000 mg | ORAL_TABLET | Freq: Two times a day (BID) | ORAL | 0 refills | Status: AC
  Filled 2024-05-31: qty 120, 60d supply, fill #0

## 2024-06-08 ENCOUNTER — Ambulatory Visit: Admitting: Family Medicine

## 2024-06-10 ENCOUNTER — Other Ambulatory Visit: Payer: Self-pay

## 2024-06-23 ENCOUNTER — Other Ambulatory Visit (HOSPITAL_BASED_OUTPATIENT_CLINIC_OR_DEPARTMENT_OTHER): Payer: Self-pay

## 2024-06-23 NOTE — Progress Notes (Signed)
 Sherry Aguilar                                          MRN: [id number]   06/23/2024   The [facility] Quality Team Specialist reviewed this patient medical record for the purposes of chart review for care gap closure. The following were reviewed: chart review for care gap closure-glycemic status assessment and kidney health evaluation for diabetes:eGFR  and uACR.    [facility] Quality Team

## 2025-03-29 ENCOUNTER — Ambulatory Visit
